# Patient Record
Sex: Female | Born: 1960 | Race: Black or African American | Hispanic: No | Smoking: Current every day smoker
Health system: Southern US, Community
[De-identification: ages and names within clinical notes are randomized; demographics above are authoritative.]

## PROBLEM LIST (undated history)

## (undated) DIAGNOSIS — I10 Essential (primary) hypertension: Secondary | ICD-10-CM

---

## 1999-04-19 ENCOUNTER — Encounter: Payer: Self-pay | Admitting: Emergency Medicine

## 1999-04-19 ENCOUNTER — Emergency Department (HOSPITAL_COMMUNITY): Admission: EM | Admit: 1999-04-19 | Discharge: 1999-04-19 | Payer: Self-pay | Admitting: Emergency Medicine

## 2009-11-21 ENCOUNTER — Emergency Department (HOSPITAL_COMMUNITY): Admission: EM | Admit: 2009-11-21 | Discharge: 2009-11-21 | Payer: Self-pay | Admitting: Emergency Medicine

## 2010-11-08 LAB — DIFFERENTIAL
Basophils Absolute: 0.1 10*3/uL (ref 0.0–0.1)
Basophils Relative: 0 % (ref 0–1)
Eosinophils Absolute: 0.3 10*3/uL (ref 0.0–0.7)
Eosinophils Relative: 3 % (ref 0–5)
Lymphocytes Relative: 30 % (ref 12–46)
Lymphs Abs: 3.4 10*3/uL (ref 0.7–4.0)
Monocytes Absolute: 0.7 10*3/uL (ref 0.1–1.0)
Monocytes Relative: 6 % (ref 3–12)
Neutro Abs: 7.1 10*3/uL (ref 1.7–7.7)
Neutrophils Relative %: 61 % (ref 43–77)

## 2010-11-08 LAB — POCT I-STAT, CHEM 8
BUN: 6 mg/dL (ref 6–23)
Calcium, Ion: 1.06 mmol/L — ABNORMAL LOW (ref 1.12–1.32)
Chloride: 109 mEq/L (ref 96–112)
Creatinine, Ser: 0.5 mg/dL (ref 0.4–1.2)
Glucose, Bld: 105 mg/dL — ABNORMAL HIGH (ref 70–99)
HCT: 38 % (ref 36.0–46.0)
Hemoglobin: 12.9 g/dL (ref 12.0–15.0)
Potassium: 3.3 mEq/L — ABNORMAL LOW (ref 3.5–5.1)
Sodium: 140 mEq/L (ref 135–145)
TCO2: 20 mmol/L (ref 0–100)

## 2010-11-08 LAB — POCT CARDIAC MARKERS
CKMB, poc: 1.2 ng/mL (ref 1.0–8.0)
Myoglobin, poc: 56.5 ng/mL (ref 12–200)
Troponin i, poc: <0.05 ng/mL (ref 0.00–0.09)

## 2010-11-08 LAB — CBC
HCT: 36 % (ref 36.0–46.0)
Hemoglobin: 12 g/dL (ref 12.0–15.0)
MCHC: 33.4 g/dL (ref 30.0–36.0)
MCV: 80.9 fL (ref 78.0–100.0)
Platelets: 210 10*3/uL (ref 150–400)
RBC: 4.46 MIL/uL (ref 3.87–5.11)
RDW: 15.9 % — ABNORMAL HIGH (ref 11.5–15.5)
WBC: 11.6 10*3/uL — ABNORMAL HIGH (ref 4.0–10.5)

## 2011-08-28 ENCOUNTER — Emergency Department (HOSPITAL_COMMUNITY)
Admission: EM | Admit: 2011-08-28 | Discharge: 2011-08-28 | Disposition: A | Discharge status: Home / Self Care | Payer: Self-pay | Attending: Emergency Medicine | Admitting: Emergency Medicine

## 2011-08-28 ENCOUNTER — Encounter: Payer: Self-pay | Admitting: *Deleted

## 2011-08-28 DIAGNOSIS — Z79899 Other long term (current) drug therapy: Secondary | ICD-10-CM | POA: Insufficient documentation

## 2011-08-28 DIAGNOSIS — F17200 Nicotine dependence, unspecified, uncomplicated: Secondary | ICD-10-CM | POA: Insufficient documentation

## 2011-08-28 DIAGNOSIS — K648 Other hemorrhoids: Secondary | ICD-10-CM | POA: Insufficient documentation

## 2011-08-28 DIAGNOSIS — K625 Hemorrhage of anus and rectum: Secondary | ICD-10-CM | POA: Insufficient documentation

## 2011-08-28 DIAGNOSIS — K921 Melena: Secondary | ICD-10-CM | POA: Insufficient documentation

## 2011-08-28 DIAGNOSIS — K644 Residual hemorrhoidal skin tags: Secondary | ICD-10-CM | POA: Insufficient documentation

## 2011-08-28 HISTORY — DX: Essential (primary) hypertension: I10

## 2011-08-28 LAB — CBC
MCH: 28.1 pg (ref 26.0–34.0)
MCHC: 34.3 g/dL (ref 30.0–36.0)
Platelets: 272 10*3/uL (ref 150–400)
RBC: 5.2 MIL/uL — ABNORMAL HIGH (ref 3.87–5.11)

## 2011-08-28 NOTE — ED Notes (Signed)
Rainbow drawn 1 blue, 1 lavender, 1 light green, 1 dark green sent to lab

## 2011-08-28 NOTE — ED Notes (Signed)
D/c instructions reviewed w/ pt - pt denies any further questions or concerns at present.   

## 2011-08-28 NOTE — ED Notes (Signed)
IV d/c'd and d/c vitals taken. RN aware and also in room explaining d/c papers.

## 2011-08-28 NOTE — ED Notes (Signed)
Pt presenting to ed with c/o rectal bleeding pt states x 2 days. Pt states blood is bright red blood noted. Pt states it could be her hemorrhoids. Pt is alert and oriented at this time. Pt is in nad

## 2011-08-28 NOTE — ED Provider Notes (Signed)
History     CSN: [id number]  Arrival date & time 08/28/11  1203   First MD Initiated Contact with Patient 08/28/11 1735      Chief Complaint  Patient presents with  . Rectal Bleeding    pt reports bright red rectal bleeding last night and again this am. pt denies complaints of pain. pt reports "feeling like something is leaking."     (Consider location/radiation/quality/duration/timing/severity/associated sxs/prior treatment) HPI Comments: Patient with known history of external hemorrhoids per her report.  Since last night she has noted bright red blood mixed with normal stool.  She's had no bleeding in between bowel movements.  She does note a recent history of being constipated and having to use stool softeners to assist her.  She reports significant straining with her bowel movements as well.  She has no abdominal pain and no pain with bowel movements at this time.  No lightheadedness, chest pain, shortness of breath or dizziness.  Patient is on no antiplatelet or anticoagulation agents.  Patient had more bleeding then she's had in the past which is what prompted her to show up in the emergency department for evaluation of her bleeding.  She has not been evaluated by a GI physician in the past per her report.  Patient is a 51 y.o. female presenting with hematochezia. The history is provided by the patient. No language interpreter was used.  Rectal Bleeding  The current episode started yesterday. The patient is experiencing no pain. The stool is described as hard. Pertinent negatives include no fever, no abdominal pain, no diarrhea, no nausea, no vomiting, no vaginal discharge, no chest pain, no headaches, no coughing and no rash.    Past Medical History  Diagnosis Date  . Hemorrhoids   . Hypertension     History reviewed. No pertinent past surgical history.  History reviewed. No pertinent family history.  History  Substance Use Topics  . Smoking status: Current Everyday Smoker    Types: Cigarettes  . Smokeless tobacco: Not on file  . Alcohol Use: Yes     occ    OB History    Grav Para Term Preterm Abortions TAB SAB Ect Mult Living                  Review of Systems  Constitutional: Negative.  Negative for fever and chills.  HENT: Negative.   Eyes: Negative.  Negative for discharge and redness.  Respiratory: Negative.  Negative for cough and shortness of breath.   Cardiovascular: Negative.  Negative for chest pain.  Gastrointestinal: Positive for hematochezia and anal bleeding. Negative for nausea, vomiting, abdominal pain and diarrhea.  Genitourinary: Negative.  Negative for dysuria and vaginal discharge.  Musculoskeletal: Negative.  Negative for back pain.  Skin: Negative.  Negative for color change and rash.  Neurological: Negative.  Negative for syncope and headaches.  Hematological: Negative.  Negative for adenopathy.  Psychiatric/Behavioral: Negative.  Negative for confusion.  All other systems reviewed and are negative.    Allergies  Review of patient's allergies indicates no known allergies.  Home Medications   Current Outpatient Rx  Name Route Sig Dispense Refill  . FAMOTIDINE 10 MG PO CHEW Oral Chew 10 mg by mouth as needed. stomach     . LISINOPRIL-HYDROCHLOROTHIAZIDE 20-25 MG PO TABS Oral Take 1 tablet by mouth daily.        BP 136/79  Pulse 77  Temp(Src) 97.9 F (36.6 C) (Oral)  Resp 18  SpO2 98%  LMP 08/15/2011  Physical Exam  Constitutional: She is oriented to person, place, and time. She appears well-developed and well-nourished.  Non-toxic appearance. She does not have a sickly appearance.  HENT:  Head: Normocephalic and atraumatic.  Eyes: Conjunctivae, EOM and lids are normal. Pupils are equal, round, and reactive to light. No scleral icterus.  Neck: Trachea normal and normal range of motion. Neck supple.  Cardiovascular: Normal rate, regular rhythm and normal heart sounds.   Pulmonary/Chest: Effort normal and breath  sounds normal. No respiratory distress. She has no wheezes. She has no rales.  Abdominal: Soft. Normal appearance. There is no tenderness. There is no rebound, no guarding and no CVA tenderness.  Genitourinary:       Patient's rectal exam demonstrates external hemorrhoids that are nontender and palpable internal hemorrhoid as well.  There is small amount of gross red blood on exam no stool palpable in the vault  Musculoskeletal: Normal range of motion.  Neurological: She is alert and oriented to person, place, and time. She has normal strength.  Skin: Skin is warm, dry and intact. No rash noted.  Psychiatric: She has a normal mood and affect. Her behavior is normal. Judgment and thought content normal.    ED Course  Procedures (including critical care time)  Results for orders placed during the hospital encounter of 08/28/11  CBC      Component Value Range   WBC 11.7 (*) 4.0 - 10.5 (K/uL)   RBC 5.20 (*) 3.87 - 5.11 (MIL/uL)   Hemoglobin 14.6  12.0 - 15.0 (g/dL)   HCT 16.1  09.6 - 04.5 (%)   MCV 81.9  78.0 - 100.0 (fL)   MCH 28.1  26.0 - 34.0 (pg)   MCHC 34.3  30.0 - 36.0 (g/dL)   RDW 40.9  81.1 - 91.4 (%)   Platelets 272  150 - 400 (K/uL)        MDM  Patient with likely rectal bleeding do to her hemorrhoids.  She has obvious external hemorrhoids and palpable internal hemorrhoid on my exam.  Her history of painless rectal bleeding is also consistent with this.  She has a stable hemoglobin at this point in time.  Patient is not on any anticoagulation or antiplatelet agents at this time.  I did have a thorough discussion with the patient regarding use of daily stool softeners and that she does not strain for her bowel movements and she understands this at time of discharge.  She also understands to followup with her primary care physician Dr. Lovell Sheehan for this and possible GI referral.  She also knows to return for any worsening bleeding, shortness of breath, lightheadedness or dizziness  associated with this.        Nat Christen, MD 08/28/11 [phone]

## 2012-01-23 ENCOUNTER — Encounter (HOSPITAL_COMMUNITY): Payer: Self-pay | Admitting: Emergency Medicine

## 2012-01-23 ENCOUNTER — Emergency Department (INDEPENDENT_AMBULATORY_CARE_PROVIDER_SITE_OTHER)
Admission: EM | Admit: 2012-01-23 | Discharge: 2012-01-23 | Disposition: A | Discharge status: Home / Self Care | Payer: Self-pay | Source: Home / Self Care | Attending: Emergency Medicine | Admitting: Emergency Medicine

## 2012-01-23 DIAGNOSIS — B029 Zoster without complications: Secondary | ICD-10-CM

## 2012-01-23 MED ORDER — ACYCLOVIR 800 MG PO TABS: 800.0000 mg | ORAL_TABLET | Freq: Every day | ORAL | Status: AC

## 2012-01-23 MED ORDER — HYDROCODONE-ACETAMINOPHEN 5-325 MG PO TABS: 2.0000 | ORAL_TABLET | ORAL | Status: AC | PRN

## 2012-01-23 MED ORDER — IBUPROFEN 600 MG PO TABS: 600.0000 mg | ORAL_TABLET | Freq: Four times a day (QID) | ORAL | Status: AC | PRN

## 2012-01-23 NOTE — ED Provider Notes (Signed)
History     CSN: [id number]  Arrival date & time 01/23/12  1333   First MD Initiated Contact with Patient 01/23/12 1426      Chief Complaint  Patient presents with  . Herpes Zoster    (Consider location/radiation/quality/duration/timing/severity/associated sxs/prior treatment) HPI Comments: Patient reports burning, stinging sensation that extended over her right upper back, under axilla to her right  breast and down her right arm 2 days ago. No further radiation down arm since first day. Reports a similar sensation in   right leg once, but none since. She reports continued paresthesias and erythema over her right upper back and extending underneath her right breast 2 days ago. Yesterday noticed a blister, and today reports multiple crops of vesicles over her back and underneath her right axilla. No nausea, vomiting, fevers. Denies rash anywhere else. Marland KitchenHer daughter "popped" one the blisters yesterday and has been rubbing alcohol on the area without relief. No aggravating factors. No new lotions, soaps, detergents. No exposure to poison ivy. She did have chickenpox as a child.  ROS as noted in HPI. All other ROS negative.   Patient is a 51 y.o. female presenting with rash. The history is provided by the patient. No language interpreter was used.  Rash  This is a new problem. The current episode started 2 days ago. The problem has been gradually worsening. The problem is associated with an unknown factor. There has been no fever. The rash is present on the torso. The pain has been constant since onset. Associated symptoms include blisters and pain. Pertinent negatives include no itching and no weeping.    Past Medical History  Diagnosis Date  . Hemorrhoids   . Hypertension     History reviewed. No pertinent past surgical history.  History reviewed. No pertinent family history.  History  Substance Use Topics  . Smoking status: Current Everyday Smoker    Types: Cigarettes  .  Smokeless tobacco: Not on file  . Alcohol Use: Yes     occ    OB History    Grav Para Term Preterm Abortions TAB SAB Ect Mult Living                  Review of Systems  Skin: Positive for rash. Negative for itching.    Allergies  Review of patient's allergies indicates no known allergies.  Home Medications   Current Outpatient Rx  Name Route Sig Dispense Refill  . LISINOPRIL-HYDROCHLOROTHIAZIDE 20-25 MG PO TABS Oral Take 1 tablet by mouth daily.      . ACYCLOVIR 800 MG PO TABS Oral Take 1 tablet (800 mg total) by mouth 5 (five) times daily. X 10 days 50 tablet 0  . FAMOTIDINE 10 MG PO CHEW Oral Chew 10 mg by mouth as needed. stomach     . HYDROCODONE-ACETAMINOPHEN 5-325 MG PO TABS Oral Take 2 tablets by mouth every 4 (four) hours as needed for pain. 20 tablet 0  . IBUPROFEN 600 MG PO TABS Oral Take 1 tablet (600 mg total) by mouth every 6 (six) hours as needed for pain. 20 tablet 0    BP 137/86  Pulse 82  Temp(Src) 98.2 F (36.8 C) (Oral)  Resp 19  SpO2 100%  LMP 01/06/2012  Physical Exam  Nursing note and vitals reviewed. Constitutional: She is oriented to person, place, and time. She appears well-developed and well-nourished. No distress.  HENT:  Head: Normocephalic and atraumatic.  Eyes: Conjunctivae and EOM are normal.  Neck: Normal range  of motion.  Cardiovascular: Normal rate.   Pulmonary/Chest: Effort normal and breath sounds normal.  Abdominal: She exhibits no distension.  Musculoskeletal: Normal range of motion.       Back:       Multiple grouped intact vesicles on erythematous base in the right T3/T4 distribution which extends to her right breast. No rash on arm or anywhere else.   Neurological: She is alert and oriented to person, place, and time.  Skin: Skin is warm and dry.  Psychiatric: She has a normal mood and affect. Her behavior is normal. Judgment and thought content normal.    ED Course  Procedures (including critical care time)  Labs  Reviewed - No data to display No results found.   1. Shingles outbreak     MDM  No signs of secondary bacterial infection. Starting on acyclovir 800 mg 5 times a day for 10 days, and additional pain medication, will have her do local wound care. Have her followup with her primary care physician as needed. Patient agrees plan.  Luiz Blare, MD 01/23/12 [phone]

## 2012-01-23 NOTE — Discharge Instructions (Signed)
Apply bacitracin or another antibiotic ointment to the blisters when they rupture and keep it covered until it heals to prevent secondary infection. Return for fever >100.4, if you get worse, or for other concerns.

## 2012-01-23 NOTE — ED Notes (Signed)
PT HERE WITH POSSIBLE  SHINGLES TO RIGHT UPPER BACK THAT FORMED X 2 DYS AGO.DAUGHTER POPPED BLISTER AND USED ALCOHOL WHICH HELPED PAIN BUT BLISTERS WORSENED.PAIN IN BACK RADIATING TO R ARM AND LEG

## 2014-09-10 ENCOUNTER — Encounter (HOSPITAL_COMMUNITY)
Admission: EM | Disposition: A | Discharge status: Home / Self Care | Payer: Self-pay | Source: Home / Self Care | Attending: Emergency Medicine

## 2014-09-10 ENCOUNTER — Ambulatory Visit: Admit: 2014-09-10 | Payer: Self-pay | Admitting: Otolaryngology

## 2014-09-10 ENCOUNTER — Encounter (HOSPITAL_COMMUNITY): Payer: Self-pay | Admitting: Registered Nurse

## 2014-09-10 ENCOUNTER — Emergency Department (HOSPITAL_COMMUNITY): Payer: Self-pay

## 2014-09-10 ENCOUNTER — Emergency Department (HOSPITAL_COMMUNITY)
Admission: EM | Admit: 2014-09-10 | Discharge: 2014-09-10 | Disposition: A | Discharge status: Home / Self Care | Payer: Self-pay | Attending: Emergency Medicine | Admitting: Emergency Medicine

## 2014-09-10 ENCOUNTER — Encounter (HOSPITAL_COMMUNITY): Payer: Self-pay | Admitting: Emergency Medicine

## 2014-09-10 DIAGNOSIS — Z79899 Other long term (current) drug therapy: Secondary | ICD-10-CM | POA: Insufficient documentation

## 2014-09-10 DIAGNOSIS — Z720 Tobacco use: Secondary | ICD-10-CM | POA: Insufficient documentation

## 2014-09-10 DIAGNOSIS — J36 Peritonsillar abscess: Secondary | ICD-10-CM | POA: Insufficient documentation

## 2014-09-10 DIAGNOSIS — I10 Essential (primary) hypertension: Secondary | ICD-10-CM | POA: Insufficient documentation

## 2014-09-10 LAB — BASIC METABOLIC PANEL
ANION GAP: 9 (ref 5–15)
BUN: 9 mg/dL (ref 6–23)
CHLORIDE: 100 mmol/L (ref 96–112)
CO2: 24 mmol/L (ref 19–32)
Calcium: 9 mg/dL (ref 8.4–10.5)
Creatinine, Ser: 0.68 mg/dL (ref 0.50–1.10)
GFR calc Af Amer: >90 mL/min (ref >90)
GFR calc non Af Amer: >90 mL/min (ref >90)
GLUCOSE: 100 mg/dL — AB (ref 70–99)
Potassium: 3.8 mmol/L (ref 3.5–5.1)
Sodium: 133 mmol/L — ABNORMAL LOW (ref 135–145)

## 2014-09-10 LAB — CBC WITH DIFFERENTIAL/PLATELET
BASOS PCT: 0 % (ref 0–1)
Basophils Absolute: 0 10*3/uL (ref 0.0–0.1)
EOS ABS: 0.2 10*3/uL (ref 0.0–0.7)
EOS PCT: 1 % (ref 0–5)
HCT: 43.4 % (ref 36.0–46.0)
HEMOGLOBIN: 14.8 g/dL (ref 12.0–15.0)
Lymphocytes Relative: 20 % (ref 12–46)
Lymphs Abs: 3.3 10*3/uL (ref 0.7–4.0)
MCH: 28.2 pg (ref 26.0–34.0)
MCHC: 34.1 g/dL (ref 30.0–36.0)
MCV: 82.7 fL (ref 78.0–100.0)
Monocytes Absolute: 1.1 10*3/uL — ABNORMAL HIGH (ref 0.1–1.0)
Monocytes Relative: 7 % (ref 3–12)
NEUTROS PCT: 72 % (ref 43–77)
Neutro Abs: 12.1 10*3/uL — ABNORMAL HIGH (ref 1.7–7.7)
Platelets: 321 10*3/uL (ref 150–400)
RBC: 5.25 MIL/uL — ABNORMAL HIGH (ref 3.87–5.11)
RDW: 14.1 % (ref 11.5–15.5)
WBC: 16.8 10*3/uL — ABNORMAL HIGH (ref 4.0–10.5)

## 2014-09-10 SURGERY — TONSILLECTOMY
Anesthesia: General

## 2014-09-10 MED ORDER — DEXAMETHASONE SODIUM PHOSPHATE 10 MG/ML IJ SOLN
INTRAMUSCULAR | Status: AC
  Filled 2014-09-10: qty 1

## 2014-09-10 MED ORDER — SODIUM CHLORIDE 0.9 % IV BOLUS (SEPSIS)
500.0000 mL | Freq: Once | INTRAVENOUS | Status: AC
  Administered 2014-09-10: 500 mL via INTRAVENOUS

## 2014-09-10 MED ORDER — BUTAMBEN-TETRACAINE-BENZOCAINE 2-2-14 % EX AERO
1.0000 | INHALATION_SPRAY | Freq: Once | CUTANEOUS | Status: AC
  Administered 2014-09-10: 1 via TOPICAL
  Filled 2014-09-10: qty 56

## 2014-09-10 MED ORDER — LIDOCAINE-EPINEPHRINE 2 %-1:100000 IJ SOLN
20.0000 mL | Freq: Once | INTRAMUSCULAR | Status: AC
  Administered 2014-09-10: 20 mL via INTRADERMAL
  Filled 2014-09-10: qty 1

## 2014-09-10 MED ORDER — ONDANSETRON HCL 4 MG/2ML IJ SOLN
INTRAMUSCULAR | Status: AC
  Filled 2014-09-10: qty 2

## 2014-09-10 MED ORDER — HYDROCODONE-ACETAMINOPHEN 7.5-325 MG/15ML PO SOLN: 15.0000 mL | ORAL | Status: DC | PRN

## 2014-09-10 MED ORDER — PROPOFOL 10 MG/ML IV BOLUS
INTRAVENOUS | Status: AC
  Filled 2014-09-10: qty 20

## 2014-09-10 MED ORDER — MORPHINE SULFATE 4 MG/ML IJ SOLN
4.0000 mg | Freq: Once | INTRAMUSCULAR | Status: AC
  Administered 2014-09-10: 4 mg via INTRAVENOUS
  Filled 2014-09-10: qty 1

## 2014-09-10 MED ORDER — AMOXICILLIN 250 MG/5ML PO SUSR: ORAL | Status: DC

## 2014-09-10 MED ORDER — LIDOCAINE HCL 1 % IJ SOLN: 30.0000 mL | Freq: Once | INTRAMUSCULAR | Status: DC

## 2014-09-10 MED ORDER — MORPHINE SULFATE 2 MG/ML IJ SOLN
2.0000 mg | Freq: Once | INTRAMUSCULAR | Status: AC
  Administered 2014-09-10: 2 mg via INTRAVENOUS
  Filled 2014-09-10: qty 1

## 2014-09-10 MED ORDER — LIDOCAINE HCL (CARDIAC) 20 MG/ML IV SOLN
INTRAVENOUS | Status: AC
  Filled 2014-09-10: qty 5

## 2014-09-10 MED ORDER — IOHEXOL 300 MG/ML  SOLN
100.0000 mL | Freq: Once | INTRAMUSCULAR | Status: AC | PRN
  Administered 2014-09-10: 100 mL via INTRAVENOUS

## 2014-09-10 MED ORDER — FENTANYL CITRATE 0.05 MG/ML IJ SOLN
INTRAMUSCULAR | Status: AC
  Filled 2014-09-10: qty 5

## 2014-09-10 MED ORDER — DEXAMETHASONE SODIUM PHOSPHATE 10 MG/ML IJ SOLN
10.0000 mg | Freq: Once | INTRAMUSCULAR | Status: AC
  Administered 2014-09-10: 10 mg via INTRAVENOUS
  Filled 2014-09-10: qty 1

## 2014-09-10 MED ORDER — MIDAZOLAM HCL 2 MG/2ML IJ SOLN
2.0000 mg | Freq: Once | INTRAMUSCULAR | Status: AC
  Administered 2014-09-10: 2 mg via INTRAVENOUS
  Filled 2014-09-10: qty 2

## 2014-09-10 MED ORDER — CLINDAMYCIN PHOSPHATE 600 MG/50ML IV SOLN
600.0000 mg | Freq: Once | INTRAVENOUS | Status: AC
  Administered 2014-09-10: 600 mg via INTRAVENOUS
  Filled 2014-09-10: qty 50

## 2014-09-10 MED ORDER — MIDAZOLAM HCL 2 MG/2ML IJ SOLN
INTRAMUSCULAR | Status: AC
  Filled 2014-09-10: qty 2

## 2014-09-10 SURGICAL SUPPLY — 19 items
CATH ROBINSON RED A/P 10FR (CATHETERS) ×3 IMPLANT
CLEANER TIP ELECTROSURG 2X2 (MISCELLANEOUS) ×3 IMPLANT
COVER SURGICAL LIGHT HANDLE (MISCELLANEOUS) ×3 IMPLANT
ELECT COATED BLADE 2.86 ST (ELECTRODE) ×3 IMPLANT
ELECT REM PT RETURN 9FT ADLT (ELECTROSURGICAL) ×3
ELECTRODE REM PT RTRN 9FT ADLT (ELECTROSURGICAL) ×1 IMPLANT
GAUZE SPONGE 4X4 16PLY XRAY LF (GAUZE/BANDAGES/DRESSINGS) ×3 IMPLANT
GLOVE ECLIPSE 8.0 STRL XLNG CF (GLOVE) ×3 IMPLANT
KIT BASIN OR (CUSTOM PROCEDURE TRAY) ×3 IMPLANT
NEEDLE SPNL 22GX3.5 QUINCKE BK (NEEDLE) ×3 IMPLANT
NS IRRIG 1000ML POUR BTL (IV SOLUTION) ×3 IMPLANT
PACK BASIC VI WITH GOWN DISP (CUSTOM PROCEDURE TRAY) ×3 IMPLANT
PENCIL FOOT CONTROL (ELECTRODE) ×3 IMPLANT
SPONGE TONSIL 1 RF SGL (DISPOSABLE) ×6 IMPLANT
SWAB COLLECTION DEVICE MRSA (MISCELLANEOUS) ×6 IMPLANT
SYR BULB 3OZ (MISCELLANEOUS) ×3 IMPLANT
TUBE ANAEROBIC SPECIMEN COL (MISCELLANEOUS) ×6 IMPLANT
WATER STERILE IRR 1500ML POUR (IV SOLUTION) ×3 IMPLANT
YANKAUER SUCT BULB TIP 10FT TU (MISCELLANEOUS) ×3 IMPLANT

## 2014-09-10 NOTE — Discharge Instructions (Signed)
Amoxicillin as prescribed.  Hydrocodone syrup as prescribed as needed for pain.  Follow-up with Dr. Lazarus SalinesWolicki next week. The contact information for his office has been provided in this discharge summary. Call on Monday to arrange this appointment.  Return to the emergency department if you develop difficulty breathing or an inability to swallow.   Peritonsillar Abscess A peritonsillar abscess is a collection of pus located in the back of the throat behind the tonsils. It usually occurs when a streptococcal infection of the throat or tonsils spreads into the space around the tonsils. They are almost always caused by the streptococcal germ (bacteria). The treatment of a peritonsillar abscess is most often drainage accomplished by putting a needle into the abscess or cutting (incising) and draining the abscess. This is most often followed with a course of antibiotics. HOME CARE INSTRUCTIONS  If your abscess was drained by your caregiver today, rinse your throat (gargle) with warm salt water four times per day or as needed for comfort. Do not swallow this mixture. Mix 1 teaspoon of salt in 8 ounces of warm water for gargling.  Rest in bed as needed. Resume activities as able.  Apply cold to your neck for pain relief. Fill a plastic bag with ice and wrap it in a towel. Hold the ice on your neck for 20 minutes 4 times per day.  Eat a soft or liquid diet as tolerated while your throat remains sore. Popsicles and ice cream may be good early choices. Drinking plenty of cold fluids will probably be soothing and help take swelling down in between the warm gargles.  Only take over-the-counter or prescription medicines for pain, discomfort, or fever as directed by your caregiver. Do not use aspirin unless directed by your physician. Aspirin slows down the clotting process. It can also cause bleeding from the drainage area if this was needled or incised today.  If antibiotics were prescribed, take them as  directed for the full course of the prescription. Even if you feel you are well, you need to take them. SEEK MEDICAL CARE IF:   You have increased pain, swelling, redness, or drainage in your throat.  You develop signs of infection such as dizziness, headache, lethargy, or generalized feelings of illness.  You have difficulty breathing, swallowing or eating.  You show signs of becoming dehydrated (lightheadedness when standing, decreased urine output, a fast heart rate, or dry mouth and mucous membranes). SEEK IMMEDIATE MEDICAL CARE IF:   You have a fever.  You are coughing up or vomiting blood.  You develop more severe throat pain uncontrolled with medicines or you start to drool.  You develop difficulty breathing, talking, or find it easier to breathe while leaning forward. Document Released: 08/06/2005 Document Revised: 10/29/2011 Document Reviewed: 03/19/2008 [facility] Patient Information 2015 [place]. This information is not intended to replace advice given to you by your health care provider. Make sure you discuss any questions you have with your health care provider.

## 2014-09-10 NOTE — ED Provider Notes (Addendum)
CSN: [id number]     Arrival date & time 09/10/14  1445 History   First MD Initiated Contact with Patient 09/10/14 1454     Chief Complaint  Patient presents with  . Oral Swelling  . Sore Throat     (Consider location/radiation/quality/duration/timing/severity/associated sxs/prior Treatment) HPI Comments: Patient is a 54 year old female with past mental history of hypertension and gastric ulcers. She presents with complaints of sore throat for the past several days that has significantly worsened. She is now having swelling and increased pain to the right side of her neck and throat. She is having difficulty opening her mouth and swallowing due to this discomfort. She denies any difficulty breathing. She denies any fevers or chills.  Patient is a 54 y.o. female presenting with pharyngitis. The history is provided by the patient.  Sore Throat This is a new problem. Episode onset: 3 days ago. The problem occurs constantly. The problem has been rapidly worsening. Pertinent negatives include no chest pain, no abdominal pain, no headaches and no shortness of breath. The symptoms are aggravated by swallowing (Opening mouth). Nothing relieves the symptoms. She has tried nothing for the symptoms. The treatment provided no relief.    Past Medical History  Diagnosis Date  . Hemorrhoids   . Hypertension    History reviewed. No pertinent past surgical history. No family history on file. History  Substance Use Topics  . Smoking status: Current Every Day Smoker    Types: Cigarettes  . Smokeless tobacco: Not on file  . Alcohol Use: Yes     Comment: occ   OB History    No data available     Review of Systems  Respiratory: Negative for shortness of breath.   Cardiovascular: Negative for chest pain.  Gastrointestinal: Negative for abdominal pain.  Neurological: Negative for headaches.  All other systems reviewed and are negative.     Allergies  Review of patient's allergies indicates no  known allergies.  Home Medications   Prior to Admission medications   Medication Sig Start Date End Date Taking? Authorizing Provider  famotidine (PEPCID AC) 10 MG chewable tablet Chew 10 mg by mouth as needed. stomach     Historical Provider, MD  lisinopril-hydrochlorothiazide (PRINZIDE,ZESTORETIC) 20-25 MG per tablet Take 1 tablet by mouth daily.      Historical Provider, MD   BP 188/89 mmHg  Pulse 96  Temp(Src) 97.8 F (36.6 C) (Oral)  Resp 20  SpO2 98%  LMP 08/20/2014 Physical Exam  Constitutional: She is oriented to person, place, and time. She appears well-developed and well-nourished. No distress.  HENT:  Head: Normocephalic and atraumatic.  The posterior oropharynx is erythematous and swollen. The right tonsil is swollen and deviated. There is significant discomfort with opening the mouth and with palpation of the soft tissues of the anterior neck. There is no crepitus. There is no stridor.  Neck: Normal range of motion. Neck supple.  Cardiovascular: Normal rate and regular rhythm.  Exam reveals no gallop and no friction rub.   No murmur heard. Pulmonary/Chest: Effort normal and breath sounds normal. No respiratory distress. She has no wheezes.  Abdominal: Soft. Bowel sounds are normal. She exhibits no distension. There is no tenderness.  Musculoskeletal: Normal range of motion.  Neurological: She is alert and oriented to person, place, and time.  Skin: Skin is warm and dry. She is not diaphoretic.  Nursing note and vitals reviewed.   ED Course  Procedures (including critical care time) Labs Review Labs Reviewed  BASIC  METABOLIC PANEL  CBC WITH DIFFERENTIAL    Imaging Review No results found.   EKG Interpretation None      MDM   Final diagnoses:  None    CT scan reveals a large peritonsillar abscess with market soft tissue inflammation. She has a white count of 17,000. She was given IV fluids and clindamycin. I've spoken with Dr. Lazarus SalinesWolicki from ENT who will  evaluate patient in the emergency department.    Geoffery Lyonsouglas Payson Evrard, MD 09/10/14 1659   Patient seen by ENT. Incision and drainage performed. Patient stable for discharge.  Geoffery Lyonsouglas Tanijah Morais, MD 09/10/14 [phone]

## 2014-09-10 NOTE — Progress Notes (Signed)
Pt confirmed with ED CM that she no longer sees Dr Della GooHarvette Jenkins. Has no pcp.  CM spoke [place]. CM discussed and provided written information for self pay pcps, importance of pcp for f/u care, www.needymeds.org, www.goodrx.com, discounted pharmacies and other [place] resources such as [facility], [facility], affordable care act,  financial assistance, DSS and  health department  Reviewed resources for [place] self [place] pcps like [facility], family medicine at [address], [facility], general medical clinics, [facility] plus others, medication resources, [facility] out patient pharmacies and housing Pt voiced understanding and appreciation of resources provided   Provided [facility] contact information Agreed to [facility]. Verified correct address and telephone number in EPIC. Referral completed

## 2014-09-10 NOTE — Consult Note (Signed)
Ellen Simmons, Ellen Simmons 54 y.o., female [id number]     Chief Complaint: sore throat  HPI: 54 yo bf, developed sore throat x 7 days.  Worse, and localizing to the RIGHT side the last 3 days.  No treatment thus far.  Painful to swallow.  Painful trismus.  No breathing difficulty.  No documented fever.  No prior issues with sore throat, strep throat, tonsillitis, or peritonsillar abscess.    No immune compromise including DM, HIV, Prednisone therapy.  Received fluids and IV Clinda in ER.  CT shows a large lucent fluid collection, RIGHT intra-tonsillar or peri-tonsillar.    Some pharyngeal and laryngeal edema.   WBC 16.8 K.  PMH: Past Medical History  Diagnosis Date  . Hemorrhoids   . Hypertension     Surg FB:PZWCHEN reviewed. No pertinent past surgical history.  FHx:  History reviewed. No pertinent family history. SocHx:  reports that she has been smoking Cigarettes.  She does not have any smokeless tobacco history on file. She reports that she drinks alcohol. She reports that she uses illicit drugs (Marijuana).  ALLERGIES: No Known Allergies   (Not in a hospital admission)  Results for orders placed or performed during the hospital encounter of 09/10/14 (from the past 48 hour(s))  Basic metabolic panel     Status: Abnormal   Collection Time: 09/10/14  3:10 PM  Result Value Ref Range   Sodium 133 (L) 135 - 145 mmol/L   Potassium 3.8 3.5 - 5.1 mmol/L   Chloride 100 96 - 112 mmol/L   CO2 24 19 - 32 mmol/L   Glucose, Bld 100 (H) 70 - 99 mg/dL   BUN 9 6 - 23 mg/dL   Creatinine, Ser 0.68 0.50 - 1.10 mg/dL   Calcium 9.0 8.4 - 10.5 mg/dL   GFR calc non Af Amer >90 >90 mL/min   GFR calc Af Amer >90 >90 mL/min    Comment: (NOTE) The eGFR has been calculated using the CKD EPI equation. This calculation has not been validated in all clinical situations. eGFR's persistently <90 mL/min signify possible Chronic Kidney Disease.    Anion gap 9 5 - 15  CBC with Differential     Status: Abnormal    Collection Time: 09/10/14  3:10 PM  Result Value Ref Range   WBC 16.8 (H) 4.0 - 10.5 K/uL   RBC 5.25 (H) 3.87 - 5.11 MIL/uL   Hemoglobin 14.8 12.0 - 15.0 g/dL   HCT 43.4 36.0 - 46.0 %   MCV 82.7 78.0 - 100.0 fL   MCH 28.2 26.0 - 34.0 pg   MCHC 34.1 30.0 - 36.0 g/dL   RDW 14.1 11.5 - 15.5 %   Platelets 321 150 - 400 K/uL   Neutrophils Relative % 72 43 - 77 %   Neutro Abs 12.1 (H) 1.7 - 7.7 K/uL   Lymphocytes Relative 20 12 - 46 %   Lymphs Abs 3.3 0.7 - 4.0 K/uL   Monocytes Relative 7 3 - 12 %   Monocytes Absolute 1.1 (H) 0.1 - 1.0 K/uL   Eosinophils Relative 1 0 - 5 %   Eosinophils Absolute 0.2 0.0 - 0.7 K/uL   Basophils Relative 0 0 - 1 %   Basophils Absolute 0.0 0.0 - 0.1 K/uL   Ct Soft Tissue Neck W Contrast  09/10/2014   CLINICAL DATA:  54 year old female with a right-sided sore throat for the past week, fever and dysphagia  EXAM: CT NECK WITH CONTRAST  TECHNIQUE: Multidetector CT imaging of  the neck was performed using the standard protocol following the bolus administration of intravenous contrast.  CONTRAST:  174m OMNIPAQUE IOHEXOL 300 MG/ML  SOLN  COMPARISON:  None.  FINDINGS: Pharynx and larynx: Hyper enhancement of the pharyngeal mucosa. There is diffuse enlargement of the right faucial tonsil. There is a large centrally low attenuating collection with thin peripheral enhancement consistent with an abscess in the right faucial tonsil measuring 2.3 x 1.6 x 2.2 cm. Extensive edema is noted involving the epiglottis, and tracking inferiorly along the aryepiglottic folds nearly to the false cords. The supraglottic airway is mildly narrowed. The nasopharyngeal airway is mildly narrowed and deviated from the right left. There is effacement of the right vallecula and right piriform sinus.  Salivary glands: Within normal limits.  Thyroid: Within normal limits.  Lymph nodes: Right cervical chain lymph nodes remain within normal limits for size and are likely reactive.  Vascular: Common  origin of the right brachiocephalic and left common carotid arteries incidentally noted. No acute vascular abnormality.  Limited intracranial: Unremarkable.  Mastoids and visualized paranasal sinuses: Normally aerated.  Skeleton: No acute fracture or aggressive appearing lytic or blastic osseous lesion. C6-C7 cervical spondylosis.  Upper chest: The lungs are clear.  Other: Very poor dentition with numerous carious teeth and periodontal disease.  IMPRESSION: 1. Acute pharyngitis and tonsillitis with large 2.3 x 1.6 x 2.2 cm abscess centered in the right faucial tonsil. There is extensive soft tissue edema extending into the epiglottis and down the aryepiglottic folds resulting in effacement of the right piriform sinus, and right vallecula. The nasopharyngeal and supraglottic airways are mildly narrowed but remain patent. No definite extension of abscess and the deep spaces of the neck although edema is noted in the right parapharyngeal space. 2. Reactive right cervical adenopathy. 3. Poor dentition with numerous carious teeth and periodontal disease.   Electronically Signed   By: HJacqulynn CadetM.D.   On: 09/10/2014 16:31    Blood pressure 195/90, pulse 106, temperature 97.8 F (36.6 C), temperature source Oral, resp. rate 20, last menstrual period 08/20/2014, SpO2 94 %.  PHYSICAL EXAM: Overall appearance:  Distressed.  + fetor oris.  Sl "hot potato" voice.  Mod trismus.  Voice phonatory, breathing easily. Head:  NCAT Ears:  clear Nose:  clear Oral Cavity:   Moist.  Few remaining teeth in poor repair.   Oral Pharynx/Hypopharynx/Larynx:  Swollen bulging RIGHT hemi soft palate, with uvular deviation to LEFT.  Minimal uvular edema.  LEFT tonsil poorly seen. Neuro:  Grossly nl Neck:  Sl tender RIGHT JDG, but no distinct adenopathy  Studies Reviewed:  CT neck    Assessment/Plan RIGHT peritonsillar abscess.  I discussed with the patient and her daughter.  I recommend I&D tonight, and given its  superficial location feel we can do this in the ER.  Questions were answered and informed consent obtained.  IV morphine 2 mg, Versed 2 mg as pre med given.    The pharynx was anesthetized with Cetacaine spray topical, then 10 ml total of 2% xylocaine with 1:100,000 epi in stages.  Upon ascertaining adequate anesthesia, a 2 cm crescent incision was made over the RIGHT superior pole of the tonsil.  No pus was encountered.  The incision was deepened bluntly with a tonsil hemostat.  Again, no pus encountered.  I probed into the body of the tonsil itself also with no pus.  No further attempt at drainage was felt indicated.    Will send her home with liquid Hydrocodone, 10-20  ml po q4h prn pain, and liquid Amoxicillin, 1 gm tid x 3 days, then 500 mg tid x 7 days.  She may advance diet and activity as comfortable.    I will see her in my office in 2 weeks if she is doing well,   3 days if she is having significant problems.  With a first episode, she would not warrant a tonsillectomy.    Ellen Simmons 1/90/1222, 6:52 PM

## 2014-09-10 NOTE — ED Notes (Signed)
Pt c/o sore throat and swelling to right side of neck that has gotten worse over the past couple of weeks. Pt state that she is getting to point she having hard time swallowing.  Pt also c/o sore on her left hand.

## 2018-10-13 ENCOUNTER — Other Ambulatory Visit (HOSPITAL_COMMUNITY): Payer: Self-pay | Admitting: *Deleted

## 2018-10-13 DIAGNOSIS — Z1231 Encounter for screening mammogram for malignant neoplasm of breast: Secondary | ICD-10-CM

## 2018-12-04 ENCOUNTER — Telehealth (HOSPITAL_COMMUNITY): Payer: Self-pay

## 2018-12-04 NOTE — Telephone Encounter (Signed)
Left message with patient about [facility] appointment that will need to be rescheduled due to CO-[id number]. Left name and number for patient to call back. °

## 2018-12-24 ENCOUNTER — Other Ambulatory Visit (HOSPITAL_COMMUNITY): Payer: Self-pay | Admitting: *Deleted

## 2018-12-24 DIAGNOSIS — Z1231 Encounter for screening mammogram for malignant neoplasm of breast: Secondary | ICD-10-CM

## 2019-01-15 ENCOUNTER — Ambulatory Visit (HOSPITAL_COMMUNITY): Payer: Self-pay

## 2019-04-09 ENCOUNTER — Ambulatory Visit (HOSPITAL_COMMUNITY): Payer: Self-pay

## 2021-07-04 ENCOUNTER — Emergency Department (HOSPITAL_COMMUNITY): Payer: 59

## 2021-07-04 ENCOUNTER — Encounter (HOSPITAL_COMMUNITY): Payer: Self-pay | Admitting: Emergency Medicine

## 2021-07-04 ENCOUNTER — Other Ambulatory Visit: Payer: Self-pay

## 2021-07-04 ENCOUNTER — Inpatient Hospital Stay (HOSPITAL_COMMUNITY)
Admission: EM | Admit: 2021-07-04 | Discharge: 2021-07-12 | DRG: 208 | Disposition: A | Discharge status: Home / Self Care | Payer: 59 | Attending: Internal Medicine | Admitting: Internal Medicine

## 2021-07-04 DIAGNOSIS — I959 Hypotension, unspecified: Secondary | ICD-10-CM | POA: Diagnosis present

## 2021-07-04 DIAGNOSIS — Z20822 Contact with and (suspected) exposure to covid-19: Secondary | ICD-10-CM | POA: Diagnosis present

## 2021-07-04 DIAGNOSIS — K5900 Constipation, unspecified: Secondary | ICD-10-CM | POA: Diagnosis present

## 2021-07-04 DIAGNOSIS — R9430 Abnormal result of cardiovascular function study, unspecified: Secondary | ICD-10-CM | POA: Diagnosis not present

## 2021-07-04 DIAGNOSIS — I5021 Acute systolic (congestive) heart failure: Secondary | ICD-10-CM | POA: Diagnosis present

## 2021-07-04 DIAGNOSIS — I130 Hypertensive heart and chronic kidney disease with heart failure and stage 1 through stage 4 chronic kidney disease, or unspecified chronic kidney disease: Secondary | ICD-10-CM | POA: Diagnosis present

## 2021-07-04 DIAGNOSIS — I161 Hypertensive emergency: Secondary | ICD-10-CM | POA: Diagnosis present

## 2021-07-04 DIAGNOSIS — J9602 Acute respiratory failure with hypercapnia: Secondary | ICD-10-CM | POA: Diagnosis present

## 2021-07-04 DIAGNOSIS — J9601 Acute respiratory failure with hypoxia: Secondary | ICD-10-CM | POA: Diagnosis present

## 2021-07-04 DIAGNOSIS — G929 Unspecified toxic encephalopathy: Secondary | ICD-10-CM | POA: Diagnosis present

## 2021-07-04 DIAGNOSIS — N182 Chronic kidney disease, stage 2 (mild): Secondary | ICD-10-CM | POA: Diagnosis present

## 2021-07-04 DIAGNOSIS — R9431 Abnormal electrocardiogram [ECG] [EKG]: Secondary | ICD-10-CM | POA: Diagnosis present

## 2021-07-04 DIAGNOSIS — N179 Acute kidney failure, unspecified: Secondary | ICD-10-CM

## 2021-07-04 DIAGNOSIS — R519 Headache, unspecified: Secondary | ICD-10-CM | POA: Diagnosis present

## 2021-07-04 DIAGNOSIS — F1910 Other psychoactive substance abuse, uncomplicated: Secondary | ICD-10-CM | POA: Diagnosis not present

## 2021-07-04 DIAGNOSIS — Z91410 Personal history of adult physical and sexual abuse: Secondary | ICD-10-CM | POA: Diagnosis not present

## 2021-07-04 DIAGNOSIS — E1165 Type 2 diabetes mellitus with hyperglycemia: Secondary | ICD-10-CM | POA: Diagnosis present

## 2021-07-04 DIAGNOSIS — F1423 Cocaine dependence with withdrawal: Secondary | ICD-10-CM | POA: Diagnosis not present

## 2021-07-04 DIAGNOSIS — J810 Acute pulmonary edema: Secondary | ICD-10-CM | POA: Diagnosis present

## 2021-07-04 DIAGNOSIS — F17210 Nicotine dependence, cigarettes, uncomplicated: Secondary | ICD-10-CM | POA: Diagnosis present

## 2021-07-04 DIAGNOSIS — E1122 Type 2 diabetes mellitus with diabetic chronic kidney disease: Secondary | ICD-10-CM | POA: Diagnosis present

## 2021-07-04 DIAGNOSIS — F1210 Cannabis abuse, uncomplicated: Secondary | ICD-10-CM | POA: Diagnosis present

## 2021-07-04 DIAGNOSIS — Z79899 Other long term (current) drug therapy: Secondary | ICD-10-CM

## 2021-07-04 DIAGNOSIS — R0609 Other forms of dyspnea: Secondary | ICD-10-CM | POA: Diagnosis not present

## 2021-07-04 DIAGNOSIS — E861 Hypovolemia: Secondary | ICD-10-CM | POA: Diagnosis present

## 2021-07-04 DIAGNOSIS — N951 Menopausal and female climacteric states: Secondary | ICD-10-CM | POA: Diagnosis not present

## 2021-07-04 DIAGNOSIS — R451 Restlessness and agitation: Secondary | ICD-10-CM | POA: Diagnosis not present

## 2021-07-04 LAB — COMPREHENSIVE METABOLIC PANEL
ALT: 191 U/L — ABNORMAL HIGH (ref 0–44)
AST: 280 U/L — ABNORMAL HIGH (ref 15–41)
Albumin: 4.2 g/dL (ref 3.5–5.0)
Alkaline Phosphatase: 184 U/L — ABNORMAL HIGH (ref 38–126)
Anion gap: 11 (ref 5–15)
BUN: 19 mg/dL (ref 6–20)
CO2: 24 mmol/L (ref 22–32)
Calcium: 8.9 mg/dL (ref 8.9–10.3)
Chloride: 106 mmol/L (ref 98–111)
Creatinine, Ser: 1.08 mg/dL — ABNORMAL HIGH (ref 0.44–1.00)
GFR, Estimated: 60 mL/min — ABNORMAL LOW (ref >60)
Glucose, Bld: 285 mg/dL — ABNORMAL HIGH (ref 70–99)
Potassium: 3.7 mmol/L (ref 3.5–5.1)
Sodium: 141 mmol/L (ref 135–145)
Total Bilirubin: 0.8 mg/dL (ref 0.3–1.2)
Total Protein: 8.2 g/dL — ABNORMAL HIGH (ref 6.5–8.1)

## 2021-07-04 LAB — URINALYSIS, ROUTINE W REFLEX MICROSCOPIC
Bilirubin Urine: NEGATIVE
Glucose, UA: >=500 mg/dL — AB
Ketones, ur: NEGATIVE mg/dL
Leukocytes,Ua: NEGATIVE
Nitrite: NEGATIVE
Protein, ur: 100 mg/dL — AB
Specific Gravity, Urine: 1.006 (ref 1.005–1.030)
pH: 6 (ref 5.0–8.0)

## 2021-07-04 LAB — MRSA NEXT GEN BY PCR, NASAL: MRSA by PCR Next Gen: NOT DETECTED

## 2021-07-04 LAB — CBC WITH DIFFERENTIAL/PLATELET
Abs Immature Granulocytes: 0.15 10*3/uL — ABNORMAL HIGH (ref 0.00–0.07)
Basophils Absolute: 0.1 10*3/uL (ref 0.0–0.1)
Basophils Relative: 1 %
Eosinophils Absolute: 0.2 10*3/uL (ref 0.0–0.5)
Eosinophils Relative: 1 %
HCT: 50.8 % — ABNORMAL HIGH (ref 36.0–46.0)
Hemoglobin: 16.2 g/dL — ABNORMAL HIGH (ref 12.0–15.0)
Immature Granulocytes: 1 %
Lymphocytes Relative: 36 %
Lymphs Abs: 6.2 10*3/uL — ABNORMAL HIGH (ref 0.7–4.0)
MCH: 26.9 pg (ref 26.0–34.0)
MCHC: 31.9 g/dL (ref 30.0–36.0)
MCV: 84.4 fL (ref 80.0–100.0)
Monocytes Absolute: 0.6 10*3/uL (ref 0.1–1.0)
Monocytes Relative: 4 %
Neutro Abs: 10.1 10*3/uL — ABNORMAL HIGH (ref 1.7–7.7)
Neutrophils Relative %: 57 %
Platelets: 268 10*3/uL (ref 150–400)
RBC: 6.02 MIL/uL — ABNORMAL HIGH (ref 3.87–5.11)
RDW: 15.1 % (ref 11.5–15.5)
WBC: 17.4 10*3/uL — ABNORMAL HIGH (ref 4.0–10.5)
nRBC: 0 % (ref 0.0–0.2)

## 2021-07-04 LAB — BLOOD GAS, ARTERIAL
Acid-base deficit: 6.4 mmol/L — ABNORMAL HIGH (ref 0.0–2.0)
Acid-base deficit: 3.8 mmol/L — ABNORMAL HIGH (ref 0.0–2.0)
Acid-base deficit: 0.5 mmol/L (ref 0.0–2.0)
Bicarbonate: 23.3 mmol/L (ref 20.0–28.0)
Bicarbonate: 25.3 mmol/L (ref 20.0–28.0)
Bicarbonate: 24.9 mmol/L (ref 20.0–28.0)
Drawn by: 42246
Drawn by: 25770
FIO2: 100
FIO2: 100
FIO2: 60
MECHVT: 400 mL
MECHVT: 400 mL
MECHVT: 400 mL
O2 Saturation: 91.5 %
O2 Saturation: 97 %
O2 Saturation: 98.1 %
PEEP: 5 cmH2O
PEEP: 8 cmH2O
PEEP: 8 cmH2O
Patient temperature: 98.6
Patient temperature: 98.6
Patient temperature: 98.6
RATE: 18 resp/min
RATE: 18 resp/min
RATE: 18 resp/min
pCO2 arterial: 65.6 mmHg (ref 32.0–48.0)
pCO2 arterial: 64.5 mmHg — ABNORMAL HIGH (ref 32.0–48.0)
pCO2 arterial: 46.2 mmHg (ref 32.0–48.0)
pH, Arterial: 7.177 — CL (ref 7.350–7.450)
pH, Arterial: 7.217 — ABNORMAL LOW (ref 7.350–7.450)
pH, Arterial: 7.351 (ref 7.350–7.450)
pO2, Arterial: 80.6 mmHg — ABNORMAL LOW (ref 83.0–108.0)
pO2, Arterial: 120 mmHg — ABNORMAL HIGH (ref 83.0–108.0)
pO2, Arterial: 116 mmHg — ABNORMAL HIGH (ref 83.0–108.0)

## 2021-07-04 LAB — GLUCOSE, CAPILLARY
Glucose-Capillary: 124 mg/dL — ABNORMAL HIGH (ref 70–99)
Glucose-Capillary: 157 mg/dL — ABNORMAL HIGH (ref 70–99)
Glucose-Capillary: 167 mg/dL — ABNORMAL HIGH (ref 70–99)

## 2021-07-04 LAB — PHOSPHORUS
Phosphorus: 5.5 mg/dL — ABNORMAL HIGH (ref 2.5–4.6)
Phosphorus: 4.7 mg/dL — ABNORMAL HIGH (ref 2.5–4.6)

## 2021-07-04 LAB — RAPID URINE DRUG SCREEN, HOSP PERFORMED
Amphetamines: NOT DETECTED
Barbiturates: NOT DETECTED
Benzodiazepines: NOT DETECTED
Cocaine: POSITIVE — AB
Opiates: NOT DETECTED
Tetrahydrocannabinol: POSITIVE — AB

## 2021-07-04 LAB — TROPONIN I (HIGH SENSITIVITY)
Troponin I (High Sensitivity): 56 ng/L — ABNORMAL HIGH (ref <18)
Troponin I (High Sensitivity): 94 ng/L — ABNORMAL HIGH (ref <18)

## 2021-07-04 LAB — RESP PANEL BY RT-PCR (FLU A&B, COVID) ARPGX2
Influenza A by PCR: NEGATIVE
Influenza B by PCR: NEGATIVE
SARS Coronavirus 2 by RT PCR: NEGATIVE

## 2021-07-04 LAB — MAGNESIUM: Magnesium: 2.4 mg/dL (ref 1.7–2.4)

## 2021-07-04 LAB — BRAIN NATRIURETIC PEPTIDE: B Natriuretic Peptide: 242 pg/mL — ABNORMAL HIGH (ref 0.0–100.0)

## 2021-07-04 MED ORDER — VITAL HIGH PROTEIN PO LIQD: 1000.0000 mL | ORAL | Status: DC

## 2021-07-04 MED ORDER — FUROSEMIDE 10 MG/ML IJ SOLN
40.0000 mg | Freq: Once | INTRAMUSCULAR | Status: AC
  Administered 2021-07-04: 40 mg via INTRAVENOUS
  Filled 2021-07-04: qty 4

## 2021-07-04 MED ORDER — CHLORHEXIDINE GLUCONATE 0.12% ORAL RINSE (MEDLINE KIT)
15.0000 mL | Freq: Two times a day (BID) | OROMUCOSAL | Status: DC
  Administered 2021-07-04 – 2021-07-09 (×4): 15 mL via OROMUCOSAL

## 2021-07-04 MED ORDER — NOREPINEPHRINE 4 MG/250ML-% IV SOLN
2.0000 ug/min | INTRAVENOUS | Status: DC
  Administered 2021-07-04: 2 ug/min via INTRAVENOUS
  Administered 2021-07-05: 5 ug/min via INTRAVENOUS
  Filled 2021-07-04 (×2): qty 250

## 2021-07-04 MED ORDER — ONDANSETRON HCL 4 MG/2ML IJ SOLN
4.0000 mg | Freq: Four times a day (QID) | INTRAMUSCULAR | Status: DC | PRN
  Administered 2021-07-04 – 2021-07-08 (×2): 4 mg via INTRAVENOUS
  Filled 2021-07-04: qty 2

## 2021-07-04 MED ORDER — NITROGLYCERIN IN D5W 200-5 MCG/ML-% IV SOLN
INTRAVENOUS | Status: AC
  Administered 2021-07-04: 50 mg
  Filled 2021-07-04: qty 250

## 2021-07-04 MED ORDER — KETAMINE HCL 50 MG/5ML IJ SOSY
PREFILLED_SYRINGE | INTRAMUSCULAR | Status: AC
  Filled 2021-07-04: qty 5

## 2021-07-04 MED ORDER — PROSOURCE TF PO LIQD: 45.0000 mL | Freq: Two times a day (BID) | ORAL | Status: DC

## 2021-07-04 MED ORDER — ETOMIDATE 2 MG/ML IV SOLN
INTRAVENOUS | Status: AC
  Administered 2021-07-04: 20 mg
  Filled 2021-07-04: qty 20

## 2021-07-04 MED ORDER — MIDAZOLAM HCL 2 MG/2ML IJ SOLN
INTRAMUSCULAR | Status: AC
  Filled 2021-07-04: qty 2

## 2021-07-04 MED ORDER — ROCURONIUM BROMIDE 10 MG/ML (PF) SYRINGE
PREFILLED_SYRINGE | INTRAVENOUS | Status: AC
  Administered 2021-07-04: 100 mg
  Filled 2021-07-04: qty 10

## 2021-07-04 MED ORDER — FREE WATER: 100.0000 mL | Freq: Four times a day (QID) | Status: DC

## 2021-07-04 MED ORDER — ORAL CARE MOUTH RINSE
15.0000 mL | OROMUCOSAL | Status: DC
  Administered 2021-07-04 – 2021-07-09 (×12): 15 mL via OROMUCOSAL

## 2021-07-04 MED ORDER — LABETALOL HCL 5 MG/ML IV SOLN
20.0000 mg | INTRAVENOUS | Status: DC | PRN
  Administered 2021-07-04 – 2021-07-06 (×3): 20 mg via INTRAVENOUS
  Filled 2021-07-04 (×4): qty 4

## 2021-07-04 MED ORDER — DEXMEDETOMIDINE HCL IN NACL 400 MCG/100ML IV SOLN
0.4000 ug/kg/h | INTRAVENOUS | Status: DC
  Administered 2021-07-04: 1.1 ug/kg/h via INTRAVENOUS
  Administered 2021-07-05: 1 ug/kg/h via INTRAVENOUS
  Filled 2021-07-04 (×2): qty 100

## 2021-07-04 MED ORDER — PROPOFOL 500 MG/50ML IV EMUL
INTRAVENOUS | Status: AC
  Filled 2021-07-04: qty 50

## 2021-07-04 MED ORDER — PROPOFOL 1000 MG/100ML IV EMUL
INTRAVENOUS | Status: AC
  Filled 2021-07-04: qty 100

## 2021-07-04 MED ORDER — CHLORHEXIDINE GLUCONATE CLOTH 2 % EX PADS
6.0000 | MEDICATED_PAD | Freq: Every day | CUTANEOUS | Status: DC
  Administered 2021-07-04 – 2021-07-06 (×3): 6 via TOPICAL

## 2021-07-04 MED ORDER — POLYETHYLENE GLYCOL 3350 17 G PO PACK
17.0000 g | PACK | Freq: Every day | ORAL | Status: DC
  Administered 2021-07-04 – 2021-07-12 (×5): 17 g
  Filled 2021-07-04 (×8): qty 1

## 2021-07-04 MED ORDER — NITROGLYCERIN IN D5W 200-5 MCG/ML-% IV SOLN: 0.0000 ug/min | INTRAVENOUS | Status: DC

## 2021-07-04 MED ORDER — FENTANYL CITRATE (PF) 100 MCG/2ML IJ SOLN
INTRAMUSCULAR | Status: AC
  Filled 2021-07-04: qty 2

## 2021-07-04 MED ORDER — OSMOLITE 1.2 CAL PO LIQD: 1000.0000 mL | ORAL | Status: DC

## 2021-07-04 MED ORDER — SODIUM CHLORIDE 0.9 % IV SOLN
250.0000 mL | INTRAVENOUS | Status: DC
  Administered 2021-07-04 – 2021-07-05 (×2): 250 mL via INTRAVENOUS

## 2021-07-04 MED ORDER — FENTANYL CITRATE (PF) 100 MCG/2ML IJ SOLN: 50.0000 ug | INTRAMUSCULAR | Status: DC | PRN

## 2021-07-04 MED ORDER — DOCUSATE SODIUM 50 MG/5ML PO LIQD
100.0000 mg | Freq: Two times a day (BID) | ORAL | Status: DC
  Administered 2021-07-04: 100 mg
  Filled 2021-07-04: qty 10

## 2021-07-04 MED ORDER — FENTANYL CITRATE (PF) 100 MCG/2ML IJ SOLN
50.0000 ug | INTRAMUSCULAR | Status: DC | PRN
  Administered 2021-07-04 (×2): 100 ug via INTRAVENOUS
  Filled 2021-07-04: qty 2

## 2021-07-04 MED ORDER — LORAZEPAM 2 MG/ML IJ SOLN
1.0000 mg | Freq: Once | INTRAMUSCULAR | Status: DC
  Filled 2021-07-04: qty 1

## 2021-07-04 MED ORDER — FENTANYL CITRATE PF 50 MCG/ML IJ SOSY
PREFILLED_SYRINGE | INTRAMUSCULAR | Status: AC
  Filled 2021-07-04: qty 2

## 2021-07-04 MED ORDER — ENOXAPARIN SODIUM 40 MG/0.4ML IJ SOSY
40.0000 mg | PREFILLED_SYRINGE | INTRAMUSCULAR | Status: DC
  Administered 2021-07-04 – 2021-07-11 (×8): 40 mg via SUBCUTANEOUS
  Filled 2021-07-04 (×8): qty 0.4

## 2021-07-04 MED ORDER — ONDANSETRON HCL 4 MG/2ML IJ SOLN
INTRAMUSCULAR | Status: AC
  Filled 2021-07-04: qty 2

## 2021-07-04 MED ORDER — DEXMEDETOMIDINE HCL IN NACL 200 MCG/50ML IV SOLN
0.4000 ug/kg/h | INTRAVENOUS | Status: DC
  Administered 2021-07-04: 1.2 ug/kg/h via INTRAVENOUS
  Administered 2021-07-04: 0.7 ug/kg/h via INTRAVENOUS
  Administered 2021-07-04: 0.4 ug/kg/h via INTRAVENOUS
  Filled 2021-07-04 (×3): qty 50

## 2021-07-04 MED ORDER — MIDAZOLAM HCL 2 MG/2ML IJ SOLN
2.0000 mg | INTRAMUSCULAR | Status: DC | PRN
  Administered 2021-07-04 (×3): 2 mg via INTRAVENOUS
  Filled 2021-07-04 (×2): qty 2

## 2021-07-04 MED ORDER — SUCCINYLCHOLINE CHLORIDE 200 MG/10ML IV SOSY
PREFILLED_SYRINGE | INTRAVENOUS | Status: AC
  Filled 2021-07-04: qty 10

## 2021-07-04 MED ORDER — PROPOFOL 1000 MG/100ML IV EMUL: 0.0000 ug/kg/min | INTRAVENOUS | Status: DC

## 2021-07-04 MED ORDER — MIDAZOLAM HCL 2 MG/2ML IJ SOLN
2.0000 mg | INTRAMUSCULAR | Status: DC | PRN
  Administered 2021-07-04: 2 mg via INTRAVENOUS
  Filled 2021-07-04 (×3): qty 2

## 2021-07-04 NOTE — ED Provider Notes (Addendum)
  Physical Exam  BP (!) 232/146   Pulse (!) 129   Temp 97.6 F (36.4 C)   Resp 18   Ht 5\' 2"  (1.575 m)   Wt 68.9 kg   SpO2 92%   BMI 27.80 kg/m   Physical Exam  ED Course/Procedures   Clinical Course as of 07/04/21 0741  Tue Jul 04, 2021  0601 Respiratory contacted to discuss initiation of Bipap. Unable to tolerate with EMS; ativan ordered to see if this will improve tolerance. [KH]  0605 Sats dropping to 85% on NRB. Patient with persistent distress. Plan for intubation for airway protection. [KH]  0734 Sepsis activation and labs NOT initiated given that clinical presentation is presently believed secondary to acute pulmonary edema and CHF.  [KH]    Clinical Course User Index [KH] 0606, PA-C    .Critical Care Performed by: Antony Madura, MD Authorized by: Derwood Kaplan, MD   Critical care provider statement:    Critical care time (minutes):  30   Critical care was necessary to treat or prevent imminent or life-threatening deterioration of the following conditions:  Respiratory failure and circulatory failure   Critical care was time spent personally by me on the following activities:  Development of treatment plan with patient or surrogate, discussions with consultants, evaluation of patient's response to treatment, examination of patient, ordering and review of radiographic studies, pulse oximetry, re-evaluation of patient's condition and ordering and performing treatments and interventions  MDM   Assuming care of patient from Dr. Derwood Kaplan   Patient in the ED for DIB. Noted to be profoundly hypertensive and in flash pulm edema. Workup thus far shows : CXR with atypical PNA. Hypercapnic resp failure, elevated WC. Pt is intubated.  Important pending results are admission.  At the time of signout, patient's map is 160+.  Patient is on 110 mics per minute of nitroglycerin.  She is about to receive 20 mg of labetalol.  She is tachycardic.  ABG reviewed and there is  hypercapnic respiratory failure.  Spoke with patient's daughter.  She last saw patient about a week and a half ago.  She indicates that patient gets her care through [facility].  She does note that patient will inhale drugs, she is unsure if there is any crack use.    Vitals:   07/04/21 0720 07/04/21 0725  BP: (!) 239/155 (!) 232/146  Pulse: (!) 131 (!) 129  Resp: 18 18  Temp: 97.8 F (36.6 C) 97.6 F (36.4 C)  SpO2: 92% 92%     07/06/21, MD 07/04/21 0743  @8 :30 Pt's FiO2 was dropped to 60% by me, patient had desats, back up to 100%. ABG pending. Requested art line. BP dropped suddenly - discontinued the nitro gtt. Family was updated again. I spoke with critical care who will admit the patient now.     07/06/21, MD 07/04/21 1031

## 2021-07-04 NOTE — ED Triage Notes (Addendum)
Pt BIB EMS from home, woke up about 0430 fine, stood up, felt a "tug" and became SOB. Pt is on NRB. Per EMS on arrival pt was tripoding, accessory muscle usage and 65% on RA. Pt has had a cough x 5 days.

## 2021-07-04 NOTE — ED Provider Notes (Addendum)
[facility]-EMERGENCY DEPT Provider Note   CSN: [id number] Arrival date & time: 07/04/21  0548     History Chief Complaint  Patient presents with   Shortness of Breath    LEVEL 5 CAVEAT 2/2 ACUITY OF CONDITION  Ellen Simmons is a 60 y.o. female.  60 year old female with a history of hypertension, hemorrhoids presents to the emergency department for shortness of breath.  She reportedly woke up at 0430 and felt "fine", but stood up and felt a "tug" in her left flank and had acute onset of shortness of breath.  Patient had sats of 65% on room air upon EMS arrival.  Noted to be tripoding with accessory muscle usage.  Oxygen saturations improved to 99% on a nonrebreather.  She reportedly has had a cough over the past 5 days.  Denies fever.  Did use crack cocaine at 1700.  Denies hx of CHF, ACS or PCI.  The history is provided by the patient and the EMS personnel. No language interpreter was used.  Shortness of Breath     Past Medical History:  Diagnosis Date   Hemorrhoids    Hypertension     There are no problems to display for this patient.   History reviewed. No pertinent surgical history.   OB History   No obstetric history on file.     No family history on file.  Social History   Tobacco Use   Smoking status: Every Day    Types: Cigarettes  Substance Use Topics   Alcohol use: Yes    Comment: occ   Drug use: Yes    Types: Marijuana    Home Medications Prior to Admission medications   Medication Sig Start Date End Date Taking? Authorizing Provider  amoxicillin (AMOXIL) 250 MG/5ML suspension Take 20 mL 3 times daily for 3 days, then 10 mL 3 times daily for the next 7 days. 09/10/14   Geoffery Lyons, MD  Dextromethorphan-Benzocaine 5-7.5 MG LOZG Use as directed 1 lozenge in the mouth or throat once.    [provider]  esomeprazole (NEXIUM) 40 MG capsule Take 40 mg by mouth daily at 12 noon.    [provider]   HYDROcodone-acetaminophen (HYCET) 7.5-325 mg/15 ml solution Take 15 mLs by mouth every 4 (four) hours as needed for moderate pain or severe pain. 09/10/14   Geoffery Lyons, MD  lisinopril-hydrochlorothiazide (PRINZIDE,ZESTORETIC) 20-25 MG per tablet Take 1 tablet by mouth daily.      [provider]    Allergies    Patient has no known allergies.  Review of Systems   Review of Systems  Unable to perform ROS: Acuity of condition  Respiratory:  Positive for shortness of breath.     Physical Exam Updated Vital Signs BP (!) 259/162   Pulse (!) 136   Temp 97.9 F (36.6 C)   Resp 18   Ht 5\' 2"  (1.575 m)   Wt 68.9 kg   SpO2 94%   BMI 27.80 kg/m   Physical Exam Vitals and nursing note reviewed.  Constitutional:      General: She is in acute distress.     Appearance: She is ill-appearing and diaphoretic.  HENT:     Head: Normocephalic.     Right Ear: External ear normal.     Left Ear: External ear normal.     Mouth/Throat:     Mouth: Mucous membranes are moist.  Eyes:     Extraocular Movements: Extraocular movements intact.  Conjunctiva/sclera: Conjunctivae normal.  Cardiovascular:     Rate and Rhythm: Regular rhythm. Tachycardia present.     Pulses: Normal pulses.  Pulmonary:     Effort: Accessory muscle usage and respiratory distress present.     Breath sounds: No stridor. Rhonchi present. No wheezing.     Comments: Tripoding with accessory muscle usage. Sats presently 97% on NRB. Chest:     Chest wall: No tenderness.  Musculoskeletal:     Cervical back: Normal range of motion.  Skin:    General: Skin is cool.     Coloration: Skin is pale.  Neurological:     Mental Status: She is alert and oriented to person, place, and time.     Coordination: Coordination normal.     Comments: Answers questions appropriately. Moving all extremities spontaneously.    ED Results / Procedures / Treatments   Labs (all labs ordered are listed, but only abnormal results  are displayed) Labs Reviewed  CBC WITH DIFFERENTIAL/PLATELET - Abnormal; Notable for the following components:      Result Value   RBC 6.02 (*)    Hemoglobin 16.2 (*)    HCT 50.8 (*)    All other components within normal limits  BLOOD GAS, ARTERIAL - Abnormal; Notable for the following components:   pH, Arterial 7.177 (*)    pCO2 arterial 65.6 (*)    pO2, Arterial 80.6 (*)    Acid-base deficit 6.4 (*)    All other components within normal limits  RESP PANEL BY RT-PCR (FLU A&B, COVID) ARPGX2  COMPREHENSIVE METABOLIC PANEL  BRAIN NATRIURETIC PEPTIDE  RAPID URINE DRUG SCREEN, HOSP PERFORMED  MAGNESIUM  BLOOD GAS, ARTERIAL  TROPONIN I (HIGH SENSITIVITY)    EKG EKG Interpretation  Date/Time:  Tuesday July 04 2021 06:18:31 EST Ventricular Rate:  139 PR Interval:  117 QRS Duration: 113 QT Interval:  398 QTC Calculation: 606 R Axis:   39 Text Interpretation: Sinus tachycardia LAE, consider biatrial enlargement Left ventricular hypertrophy Prolonged QT interval REPOLARIZATION ABNORMALITY probably secondary to LVH When compared with ECG of 11/20/2009, HEART RATE has increased REPOLARIZATION ABNORMALITY is more pronounced Confirmed by Dione Booze ([id number]) on 07/04/2021 6:24:01 AM  Radiology DG Chest Port 1 View  Result Date: 07/04/2021 CLINICAL DATA:  60 year old female intubated. EXAM: PORTABLE CHEST 1 VIEW COMPARISON:  Portable chest 11/21/2009. FINDINGS: Portable AP supine view at 0621 hours. Endotracheal tube tip is about 14 mm above the carina in good position. Enteric tube terminates in the stomach, side hole at the gastric body. Diffuse coarse bilateral pulmonary opacity, most confluent at the left lung base. No pneumothorax or pleural effusion evident on these supine views. Mediastinal contours appear stable and within normal limits. No acute osseous abnormality identified. Negative visible bowel gas. IMPRESSION: 1. Endotracheal tube and enteric tube in good position. 2. Diffuse  coarse bilateral pulmonary opacity, most confluent at the left lung base. Top differential considerations are viral/atypical bilateral pneumonia and acute pulmonary edema. Electronically Signed   By: Odessa Fleming M.D.   On: 07/04/2021 06:41    Procedures .Critical Care Performed by: Antony Madura, PA-C Authorized by: Antony Madura, PA-C   Critical care provider statement:    Critical care time (minutes):  45   Critical care was necessary to treat or prevent imminent or life-threatening deterioration of the following conditions:  Respiratory failure   Critical care was time spent personally by me on the following activities:  Development of treatment plan with patient or surrogate, discussions with consultants, evaluation  of patient's response to treatment, examination of patient, ordering and review of laboratory studies, ordering and review of radiographic studies, ordering and performing treatments and interventions, pulse oximetry, re-evaluation of patient's condition, review of old charts and obtaining history from patient or surrogate   I assumed direction of critical care for this patient from another provider in my specialty: no     Care discussed with: admitting provider     Medications Ordered in ED Medications  propofol (DIPRIVAN) 1000 MG/100ML infusion (has no administration in time range)  nitroGLYCERIN 50 mg in dextrose 5 % 250 mL (0.2 mg/mL) infusion (85 mcg/min Intravenous Rate/Dose Change 07/04/21 0714)  etomidate (AMIDATE) 2 MG/ML injection (20 mg  Given 07/04/21 0615)  rocuronium bromide 100 MG/10ML SOSY (100 mg  Given 07/04/21 0646)  propofol (DIPRIVAN) 500 MG/50ML infusion (  New Bag/Given 07/04/21 0625)  furosemide (LASIX) injection 40 mg (40 mg Intravenous Given 07/04/21 2111)    ED Course  I have reviewed the triage vital signs and the nursing notes.  Pertinent labs & imaging results that were available during my care of the patient were reviewed by me and considered in my  medical decision making (see chart for details).  Clinical Course as of 07/04/21 0736  Tue Jul 04, 2021  0601 Respiratory contacted to discuss initiation of Bipap. Unable to tolerate with EMS; ativan ordered to see if this will improve tolerance. [KH]  0605 Sats dropping to 85% on NRB. Patient with persistent distress. Plan for intubation for airway protection. [KH]  0734 Sepsis activation and labs NOT initiated given that clinical presentation is presently believed secondary to acute pulmonary edema and CHF.  [KH]    Clinical Course User Index [KH] Antony Madura, PA-C   MDM Rules/Calculators/A&P                           Critically ill 60 year old female presenting to the emergency department in acute respiratory distress with concern for acute pulmonary edema/CHF exacerbation.  Presented on a nonrebreather but became hypoxic despite this.  Was unable to trial BiPAP due to acuity of condition.  Was intubated for airway protection.  Started on nitroglycerin drip.  Pending remainder of laboratory work-up, but will necessitate admission to the ICU for further management.  Consultation for admission to be discussed between intensivist and MD Preston Fleeting.   Final Clinical Impression(s) / ED Diagnoses Final diagnoses:  Acute respiratory failure with hypoxia [facility])    Rx / DC Orders ED Discharge Orders     None        Antony Madura, PA-C 07/04/21 0716    Dione Booze, MD 07/04/21 0720    Antony Madura, PA-C 07/04/21 7356    Dione Booze, MD 07/10/21 [phone]

## 2021-07-04 NOTE — Progress Notes (Signed)
Initial Nutrition Assessment  DOCUMENTATION CODES:   Not applicable  INTERVENTION:  - will order TF regimen: Osmolite 1.2 @ 45 ml/hr with 45 ml Prosource TF BID with 100 ml free water QID. - this regimen will provide 1376 kcal, 82 grams protein, and 1286 ml free water.    NUTRITION DIAGNOSIS:   Inadequate oral intake related to inability to eat as evidenced by NPO status.  GOAL:   Patient will meet greater than or equal to 90% of their needs  MONITOR:   Vent status, TF tolerance, Labs, Weight trends  REASON FOR ASSESSMENT:   Ventilator, Consult Enteral/tube feeding initiation and management  ASSESSMENT:   60 year old female with medical history of HTN and hemorrhoids. She presented to the ED via EMS overnight on 11/15 due to shortness of breath. She reported having a cough x5 days. She required intubation in the ED.  Patient discussed in rounds this AM.   Patient remains intubated and has OGT in place (gastric).   Patient's daughter is at bedside and to her knowledge no changes in appetite or eating habits PTA and no recent weight changes.  Weight today documented as 152 lb and no other weight recordings available in the chart.    Patient is currently intubated on ventilator support MV: 7.1 L/min Temp (24hrs), Avg:97.4 F (36.3 C), Min:96.5 F (35.8 C), Max:98 F (36.7 C) Propofol: none BP: 135/90 and MAP: 104  Labs reviewed; creatinine: 1.08 mg/dl, LFTs elevated.  Medications reviewed; 100 mg colace BID, 40 mg IV lasix x1 dose 11/15, 17 g miralax/day.  Drip; levo @ 2 mcg/min.     NUTRITION - FOCUSED PHYSICAL EXAM:  Completed; no muscle or fat depletions, mild pitting edema to BLE.   Diet Order:   Diet Order     None       EDUCATION NEEDS:   No education needs have been identified at this time  Skin:  Skin Assessment: Reviewed RN Assessment  Last BM:  PTA/unknown  Height:   Ht Readings from Last 1 Encounters:  07/04/21 5\' 2"  (1.575 m)     Weight:   Wt Readings from Last 1 Encounters:  07/04/21 68.9 kg     Estimated Nutritional Needs:  Kcal:  1320 kcal Protein:  83-103 grams Fluid:  >/= 1.6 L/day      07/06/21, MS, RD, LDN, CNSC Inpatient Clinical Dietitian RD pager # available in [facility]  After hours/weekend pager # available in [facility]

## 2021-07-04 NOTE — ED Provider Notes (Signed)
60 year old female comes in acutely short of breath since about 5 AM, had had a cough for the last 5 days.  On exam, she is in acute respiratory distress with audible gurgling.  She was noted to be hypoxic, even with nonrebreather mask and needed to be emergently intubated.  Procedure Name: Intubation Date/Time: 07/04/2021 6:26 AM Performed by: Dione Booze, MD Pre-anesthesia Checklist: Patient identified, Patient being monitored, Emergency Drugs available, Timeout performed and Suction available Oxygen Delivery Method: Non-rebreather mask Preoxygenation: Pre-oxygenation with 100% oxygen Induction Type: Rapid sequence Ventilation: Mask ventilation without difficulty Laryngoscope Size: Glidescope and 4 Grade View: Grade I Tube size: 7.5 mm Number of attempts: 1 Airway Equipment and Method: Rigid stylet and Video-laryngoscopy Placement Confirmation: ETT inserted through vocal cords under direct vision, CO2 detector and Breath sounds checked- equal and bilateral Secured at: 23 cm Tube secured with: ETT holder Dental Injury: Teeth and Oropharynx as per pre-operative assessment     Chest x-ray shows adequate position of ET tube, fluffy airspace disease consistent with CHF exacerbation.  She is acutely hypertensive, started on propofol for sedation, nitroglycerin for blood pressure control and also given furosemide.  Blood pressure continues to be elevated, will add labetalol to try to reduce it.  ABG showed respiratory acidosis which should correct with ongoing mechanical ventilation.  WBC is elevated and hemoglobin is also slightly elevated.  Chemistries are pending.  Critical care has been consulted, have not answered yet.  Case is signed out to Dr. Rhunette Croft.  CRITICAL CARE Performed by: Dione Booze Total critical care time: 90 minutes Critical care time was exclusive of separately billable procedures and treating other patients. Critical care was necessary to treat or prevent imminent or  life-threatening deterioration. Critical care was time spent personally by me on the following activities: development of treatment plan with patient and/or surrogate as well as nursing, discussions with consultants, evaluation of patient's response to treatment, examination of patient, obtaining history from patient or surrogate, ordering and performing treatments and interventions, ordering and review of laboratory studies, ordering and review of radiographic studies, pulse oximetry and re-evaluation of patient's condition.   Dione Booze, MD 07/04/21 [phone]

## 2021-07-04 NOTE — Progress Notes (Signed)
Assisted with transporting PT while on vent (100% Fi02) to [facility] ICU 1222- uneventful. ICU RT aware that PT has arrived.

## 2021-07-04 NOTE — ED Notes (Signed)
BP and O2 have improved.

## 2021-07-04 NOTE — H&P (Signed)
NAME:  Ellen Simmons, MRN:  [id number], DOB:  February 08, 1961, LOS: 0 ADMISSION DATE:  07/04/2021, CONSULTATION DATE:  11/15 REFERRING MD:  Kathrynn Humble, CHIEF COMPLAINT:  respiratory failure    History of Present Illness:  60 year old female with PMH as below, which is significant for HTN who presented to [facility] ED 11/15 with complaints of SOB. Acute onset of symptoms at approximately 0430 after being awoken from sleep. EMS was called and upon their arrival she was found to have oxygen saturations 65% on room air, which quickly corrected to 99% with supplemental oxygen via non-rebreather mask. Complained of cough x 5 days upon arrival to ED. Also reported using crack the evening prior. She was hypertensive raising concern for pulmonary edema. BiPAP was considered, but could not be tolerate and the patient ultimately required intubation by the EDP. She was started on nitroglycerine infusion. PCCM called for admission.   Pertinent  Medical History   has a past medical history of Hemorrhoids and Hypertension.   Significant Hospital Events: Including procedures, antibiotic start and stop dates in addition to other pertinent events   11/15 admitted with hypertensive emergency/pulm edema on vent.   Interim History / Subjective:    Objective   Blood pressure 107/67, pulse 66, temperature (!) 97 F (36.1 C), resp. rate 18, height _0  (1.575 m), weight 68.9 kg, SpO2 95 %.    Vent Mode: PRVC FiO2 (%):  [60 %-100 %] 100 % Set Rate:  [18 bmp] 18 bmp Vt Set:  [400 mL] 400 mL PEEP:  [5 cmH20] 5 cmH20 Plateau Pressure:  [20 cmH20] 20 cmH20  No intake or output data in the 24 hours ending 07/04/21 0815 Filed Weights   07/04/21 0557  Weight: 68.9 kg    Examination: General: Elderly appearing female in NAD on vent HENT: [place]/AT, PERRL, no JVD Lungs: Coarse crackles throughout.  Cardiovascular: Tachy, regular, no MRG Abdomen: Soft, non-tender, non-distended Extremities: No acute deformity, no edema.   Neuro: Sedated with occasional agitation.    CXR reviewed 11/15. Diffuse infiltrates consistent with pulmonary edema given clinical picture.   Resolved Hospital Problem list     Assessment & Plan:   Hypertensive Emergency - SBP goal ~ 180 - 200 mmHg - Goal being met with sedation alone - at 1330 can liberalize to less than 153mHg - Will hold home lisinopril/HCTZ due to AKI (last creatinine in EMR is 0.6, now 1.1)  Acute hypoxemic respiratory failure: pulmonary edema in the setting of hypertensive emergency. - Full vent support - ABG reviewed, settings adjusted - VAP bundle - Hopefully can optimize today for weaning trials tomorrow.   AKI: - BP management as above.   Crack/Cocaine abuse - will need substance counseling when appropriate.   Leukocytosis: likely reactive - no plans for ABX at this time.  - Ongoing evaluaiton.  Best Practice (right click and "Reselect all SmartList Selections" daily)   Diet/type: NPO DVT prophylaxis: prophylactic heparin  GI prophylaxis: PPI Lines: N/A Foley:  N/A Code Status:  full code Last date of multidisciplinary goals of care discussion [  ]  Labs   CBC: Recent Labs  Lab 07/04/21 0640  WBC 17.4*  NEUTROABS 10.1*  HGB 16.2*  HCT 50.8*  MCV 84.4  PLT 2976   Basic Metabolic Panel: Recent Labs  Lab 07/04/21 0640  NA 141  K 3.7  CL 106  CO2 24  GLUCOSE 285*  BUN 19  CREATININE 1.08*  CALCIUM 8.9  MG 2.4  GFR: Estimated Creatinine Clearance: 52.3 mL/min (A) (by C-G formula based on SCr of 1.08 mg/dL (H)). Recent Labs  Lab 07/04/21 0640  WBC 17.4*    Liver Function Tests: Recent Labs  Lab 07/04/21 0640  AST 280*  ALT 191*  ALKPHOS 184*  BILITOT 0.8  PROT 8.2*  ALBUMIN 4.2   No results for input(s): LIPASE, AMYLASE in the last 168 hours. No results for input(s): AMMONIA in the last 168 hours.  ABG    Component Value Date/Time   PHART 7.177 (LL) 07/04/2021 0625   PCO2ART 65.6 (HH) 07/04/2021  0625   PO2ART 80.6 (L) 07/04/2021 0625   HCO3 23.3 07/04/2021 0625   TCO2 20 11/21/2009 0213   ACIDBASEDEF 6.4 (H) 07/04/2021 0625   O2SAT 91.5 07/04/2021 0625     Coagulation Profile: No results for input(s): INR, PROTIME in the last 168 hours.  Cardiac Enzymes: No results for input(s): CKTOTAL, CKMB, CKMBINDEX, TROPONINI in the last 168 hours.  HbA1C: No results found for: HGBA1C  CBG: No results for input(s): GLUCAP in the last 168 hours.  Review of Systems:   Patient is encephalopathic and/or intubated. Therefore history has been obtained from chart review.   Past Medical History:  She,  has a past medical history of Hemorrhoids and Hypertension.   Surgical History:  History reviewed. No pertinent surgical history.   Social History:   reports that she has been smoking cigarettes. She does not have any smokeless tobacco history on file. She reports current alcohol use. She reports current drug use. Drug: Marijuana.   Family History:  Her family history is not on file.   Allergies No Known Allergies   Home Medications  Prior to Admission medications   Medication Sig Start Date End Date Taking? Authorizing Provider  acetaminophen (TYLENOL) 500 MG tablet Take 1,000 mg by mouth every 6 (six) hours as needed for mild pain.   Yes [provider]  amoxicillin (AMOXIL) 250 MG/5ML suspension Take 20 mL 3 times daily for 3 days, then 10 mL 3 times daily for the next 7 days. 09/10/14   Veryl Speak, MD  Dextromethorphan-Benzocaine 5-7.5 MG LOZG Use as directed 1 lozenge in the mouth or throat once.    [provider]  esomeprazole (NEXIUM) 40 MG capsule Take 40 mg by mouth daily at 12 noon.    [provider]  HYDROcodone-acetaminophen (HYCET) 7.5-325 mg/15 ml solution Take 15 mLs by mouth every 4 (four) hours as needed for moderate pain or severe pain. 09/10/14   Veryl Speak, MD  lisinopril-hydrochlorothiazide (PRINZIDE,ZESTORETIC) 20-25 MG per  tablet Take 1 tablet by mouth daily.      [provider]     Critical care time: 39 minutes      Georgann Housekeeper, AGACNP-BC [place]  See Amion for personal pager PCCM on call pager [phone] until 7pm. Please call Elink 7p-7a. [phone]  07/04/2021 8:33 AM

## 2021-07-04 NOTE — ED Notes (Signed)
Family at bedside, update provided at this time.

## 2021-07-04 NOTE — Progress Notes (Signed)
IV consult placed for vasopressor US guided PIV. Per Primary RN, not needed at this time. Will monitor.

## 2021-07-04 NOTE — Progress Notes (Signed)
Notified Lab [phone]) that ABG being sent for analysis.

## 2021-07-04 NOTE — ED Notes (Signed)
O2 dropped around 79 %, o2 increased. Bp also dropped to 86/60. Nitro and propofol paused. Dr. Rhunette Croft informed and he reassessed patient.

## 2021-07-04 NOTE — ED Notes (Signed)
Date and time results received: 07/04/21 0644 (use smartphrase ".now" to insert current time)  Test: ABG ph / pCO2  Critical Value: pH 7.177 / pCO2 65.6  Name of Provider Notified: Preston Fleeting, MD  Orders Received? Or Actions Taken?:  n/a

## 2021-07-05 ENCOUNTER — Inpatient Hospital Stay (HOSPITAL_COMMUNITY): Payer: 59

## 2021-07-05 DIAGNOSIS — R0609 Other forms of dyspnea: Secondary | ICD-10-CM

## 2021-07-05 DIAGNOSIS — J9601 Acute respiratory failure with hypoxia: Secondary | ICD-10-CM | POA: Diagnosis not present

## 2021-07-05 LAB — CBC
HCT: 42.6 % (ref 36.0–46.0)
Hemoglobin: 13.8 g/dL (ref 12.0–15.0)
MCH: 27 pg (ref 26.0–34.0)
MCHC: 32.4 g/dL (ref 30.0–36.0)
MCV: 83.2 fL (ref 80.0–100.0)
Platelets: 251 10*3/uL (ref 150–400)
RBC: 5.12 MIL/uL — ABNORMAL HIGH (ref 3.87–5.11)
RDW: 15.2 % (ref 11.5–15.5)
WBC: 13.2 10*3/uL — ABNORMAL HIGH (ref 4.0–10.5)
nRBC: 0 % (ref 0.0–0.2)

## 2021-07-05 LAB — BASIC METABOLIC PANEL
Anion gap: 9 (ref 5–15)
BUN: 29 mg/dL — ABNORMAL HIGH (ref 6–20)
CO2: 24 mmol/L (ref 22–32)
Calcium: 8.8 mg/dL — ABNORMAL LOW (ref 8.9–10.3)
Chloride: 108 mmol/L (ref 98–111)
Creatinine, Ser: 1.18 mg/dL — ABNORMAL HIGH (ref 0.44–1.00)
GFR, Estimated: 54 mL/min — ABNORMAL LOW (ref >60)
Glucose, Bld: 175 mg/dL — ABNORMAL HIGH (ref 70–99)
Potassium: 3.8 mmol/L (ref 3.5–5.1)
Sodium: 141 mmol/L (ref 135–145)

## 2021-07-05 LAB — PHOSPHORUS
Phosphorus: 3.8 mg/dL (ref 2.5–4.6)
Phosphorus: 3.4 mg/dL (ref 2.5–4.6)

## 2021-07-05 LAB — ECHOCARDIOGRAM COMPLETE
AR max vel: 2.48 cm2
AV Area VTI: 2.49 cm2
AV Area mean vel: 2.39 cm2
AV Mean grad: 3 mmHg
AV Peak grad: 5.2 mmHg
Ao pk vel: 1.14 m/s
Area-P 1/2: 4.06 cm2
Calc EF: 47.4 %
Height: 62 in
S' Lateral: 3.3 cm
Single Plane A2C EF: 48.6 %
Single Plane A4C EF: 47 %
Weight: 2402.13 oz

## 2021-07-05 LAB — GLUCOSE, CAPILLARY
Glucose-Capillary: 185 mg/dL — ABNORMAL HIGH (ref 70–99)
Glucose-Capillary: 144 mg/dL — ABNORMAL HIGH (ref 70–99)
Glucose-Capillary: 73 mg/dL (ref 70–99)
Glucose-Capillary: 105 mg/dL — ABNORMAL HIGH (ref 70–99)
Glucose-Capillary: 106 mg/dL — ABNORMAL HIGH (ref 70–99)

## 2021-07-05 LAB — HEMOGLOBIN A1C
Hgb A1c MFr Bld: 6.3 % — ABNORMAL HIGH (ref 4.8–5.6)
Mean Plasma Glucose: 134.11 mg/dL

## 2021-07-05 LAB — HIV ANTIBODY (ROUTINE TESTING W REFLEX): HIV Screen 4th Generation wRfx: NONREACTIVE

## 2021-07-05 LAB — TRIGLYCERIDES: Triglycerides: 80 mg/dL (ref <150)

## 2021-07-05 LAB — MAGNESIUM: Magnesium: 2 mg/dL (ref 1.7–2.4)

## 2021-07-05 MED ORDER — INSULIN ASPART 100 UNIT/ML IJ SOLN
0.0000 [IU] | Freq: Three times a day (TID) | INTRAMUSCULAR | Status: DC
  Administered 2021-07-06: 17:00:00 5 [IU] via SUBCUTANEOUS
  Administered 2021-07-08 – 2021-07-10 (×4): 2 [IU] via SUBCUTANEOUS
  Administered 2021-07-11: 3 [IU] via SUBCUTANEOUS
  Administered 2021-07-11: 2 [IU] via SUBCUTANEOUS
  Administered 2021-07-11: 3 [IU] via SUBCUTANEOUS
  Administered 2021-07-12: 2 [IU] via SUBCUTANEOUS

## 2021-07-05 MED ORDER — PANTOPRAZOLE SODIUM 40 MG PO TBEC
40.0000 mg | DELAYED_RELEASE_TABLET | Freq: Every day | ORAL | Status: DC
  Administered 2021-07-05 – 2021-07-12 (×7): 40 mg via ORAL
  Filled 2021-07-05 (×8): qty 1

## 2021-07-05 MED ORDER — LOSARTAN POTASSIUM 50 MG PO TABS
100.0000 mg | ORAL_TABLET | Freq: Every day | ORAL | Status: DC
  Administered 2021-07-05 – 2021-07-12 (×8): 100 mg via ORAL
  Filled 2021-07-05 (×8): qty 2

## 2021-07-05 MED ORDER — LIP MEDEX EX OINT
TOPICAL_OINTMENT | CUTANEOUS | Status: DC | PRN
  Filled 2021-07-05: qty 7

## 2021-07-05 MED ORDER — PAROXETINE HCL 10 MG PO TABS
10.0000 mg | ORAL_TABLET | Freq: Every day | ORAL | Status: DC
  Administered 2021-07-05 – 2021-07-08 (×4): 10 mg via ORAL
  Filled 2021-07-05 (×5): qty 1

## 2021-07-05 MED ORDER — LOSARTAN POTASSIUM 50 MG PO TABS: 100.0000 mg | ORAL_TABLET | Freq: Every day | ORAL | Status: DC

## 2021-07-05 MED ORDER — INSULIN ASPART 100 UNIT/ML IJ SOLN
1.0000 [IU] | INTRAMUSCULAR | Status: DC
Start: 2021-07-05 — End: 2021-07-05
  Administered 2021-07-05: 2 [IU] via SUBCUTANEOUS
  Administered 2021-07-05: 1 [IU] via SUBCUTANEOUS

## 2021-07-05 MED ORDER — LORATADINE 10 MG PO TABS
10.0000 mg | ORAL_TABLET | Freq: Every day | ORAL | Status: DC
  Administered 2021-07-05 – 2021-07-12 (×8): 10 mg via ORAL
  Filled 2021-07-05 (×8): qty 1

## 2021-07-05 MED ORDER — HYDROCHLOROTHIAZIDE 25 MG PO TABS
25.0000 mg | ORAL_TABLET | Freq: Every day | ORAL | Status: DC
  Administered 2021-07-05 – 2021-07-06 (×2): 25 mg via ORAL
  Filled 2021-07-05 (×2): qty 1

## 2021-07-05 NOTE — Progress Notes (Signed)
eLink Physician-Brief Progress Note Patient Name: Ellen Simmons DOB: 1960-08-24 MRN: [id number]   Date of Service  07/05/2021  HPI/Events of Note  Patient with persistent hyperglycemia on CBG monitoring, per bedside RN patient is not a known diabetic.  eICU Interventions  Hemoglobin A1c ordered, sensitive hyperglycemia scale ordered for SSI coverage, no extraneous sources of glucose beyond tube feed identified.        Thomasene Lot [name] 07/05/2021, 4:15 AM

## 2021-07-05 NOTE — Procedures (Signed)
Extubation Procedure Note  Patient Details:   Name: ACELYN BASHAM DOB: [birth year] MRN: [id number]   Airway Documentation:    Vent end date: 07/05/21 Vent end time: 1152   Evaluation  O2 sats: stable throughout Complications: No apparent complications Patient did tolerate procedure well. Bilateral Breath Sounds: Clear, Diminished   Yes  Pt was extubated to 2L [place] per CCMD order. Pt was able to tolerate this well with saturations of 96%. Pt was suctioned and a positive cuff leak was heard prior to extubation. No stridor heard at this time and pt was able to speak afterwards.   Trebor Galdamez A Jarron Curley 07/05/2021, 11:53 AM

## 2021-07-05 NOTE — TOC Progression Note (Signed)
Transition of Care [facility]) - Progression Note    Patient Details  Name: Ellen Simmons MRN: [id number] Date of Birth: [birth year]  [facility] CM/SW Contact  Geni Bers, RN Phone Number: 07/05/2021, 12:33 PM  Clinical Narrative:    TOC reviewed chart. Will continue to follow for discharge needs.    Expected Discharge Plan: Home/Self Care Barriers to Discharge: No Barriers Identified  Expected Discharge Plan and Services Expected Discharge Plan: Home/Self Care       Living arrangements for the past 2 months: Single Family Home                                       Social Determinants of Health (SDOH) Interventions    Readmission Risk Interventions No flowsheet data found.

## 2021-07-05 NOTE — Progress Notes (Signed)
NAME:  Ellen Simmons, MRN:  [id number], DOB:  1960/11/08, LOS: 1 ADMISSION DATE:  07/04/2021, CONSULTATION DATE:  11/15 REFERRING MD:  Rhunette Croft, CHIEF COMPLAINT:  respiratory failure    History of Present Illness:  60 year old female with PMH as below, which is significant for HTN who presented to [facility] ED 11/15 with complaints of SOB. Acute onset of symptoms at approximately 0430 after being awoken from sleep. EMS was called and upon their arrival she was found to have oxygen saturations 65% on room air, which quickly corrected to 99% with supplemental oxygen via non-rebreather mask. Complained of cough x 5 days upon arrival to ED. Also reported using crack the evening prior. She was hypertensive raising concern for pulmonary edema. BiPAP was considered, but could not be tolerate and the patient ultimately required intubation by the EDP. She was started on nitroglycerine infusion. PCCM called for admission.   Pertinent  Medical History   has a past medical history of Hemorrhoids and Hypertension.  Significant Hospital Events: Including procedures, antibiotic start and stop dates in addition to other pertinent events   11/15 admitted with hypertensive emergency/pulm edema on vent. 11/16 No acute events overnight   Interim History / Subjective:  Alert and interactive on vent   Objective   Blood pressure 112/70, pulse 68, temperature 98.3 F (36.8 C), temperature source Axillary, resp. rate (!) 21, height 5\' 2"  (1.575 m), weight 68.1 kg, SpO2 97 %.    Vent Mode: PSV;CPAP FiO2 (%):  [30 %-80 %] 30 % Set Rate:  [18 bmp] 18 bmp Vt Set:  [400 mL] 400 mL PEEP:  [8 cmH20] 8 cmH20 Pressure Support:  [10 cmH20] 10 cmH20 Plateau Pressure:  [16 cmH20-19 cmH20] 19 cmH20   Intake/Output Summary (Last 24 hours) at 07/05/2021 0909 Last data filed at 07/05/2021 0724 Gross per 24 hour  Intake 1018.23 ml  Output 685 ml  Net 333.23 ml   Filed Weights   07/04/21 0557 07/05/21 0500  Weight:  68.9 kg 68.1 kg    Examination: General: Acute on chronically ill appearing middle aged female on mechanical ventilation, in NAD HEENT: ETT, MM pink/moist, PERRL,  Neuro: Will awake to verbal stimuli on vent  CV: s1s2 regular rate and rhythm, no murmur, rubs, or gallops,  PULM:  Clear to ascultation bilaterally, no increased work of breathing  GI: soft, bowel sounds active in all 4 quadrants, non-tender, non-distended Extremities: warm/dry, no edema  Skin: no rashes or lesions  Resolved Hospital Problem list     Assessment & Plan:   Hypertensive Emergency -Presented with BP 233/125 -Required brief nitro drip  -Home medications include HCTZ and Cozaar P: BP lowered with sedation and required short pressor support SBP goal less than 160 Hold home medications  Continuous telemetry   Acute hypoxemic respiratory failure -Pulmonary edema in the setting of hypertensive emergency. P: Continue ventilator support with lung protective strategies  Tolerating SBT can likely extubate this am  Wean PEEP and FiO2 for sats greater than 90%. Head of bed elevated 30 degrees. Plateau pressures less than 30 cm H20.  Follow intermittent chest x-ray and ABG.   SAT/SBT as tolerated, mentation preclude extubation  Ensure adequate pulmonary hygiene  Follow cultures  VAP bundle in place  PAD protocol  Mild Acute Kidney Injury  -in the setting of hypertensive emergency. Baseline 0.68 09/10/2014, creatinine currently 1.18 P: Follow renal function  Monitor urine output Trend Bmet Avoid nephrotoxins Ensure adequate renal perfusion   Crack/Cocaine abuse P:  Cessation education education when appropriate   Leukocytosis -Likely reactive, downtrended to 13.2 P: Trend CBC and fever curve   Best Practice (right click and "Reselect all SmartList Selections" daily)   Diet/type: NPO DVT prophylaxis: prophylactic heparin  GI prophylaxis: PPI Lines: N/A Foley:  N/A Code Status:  full  code Last date of multidisciplinary goals of care discussion: Pending   Critical care time:    Performed by: Adrianne Shackleton D. [name]  Total critical care time: 40 minutes  Critical care time was exclusive of separately billable procedures and treating other patients.  Critical care was necessary to treat or prevent imminent or life-threatening deterioration.  Critical care was time spent personally by me on the following activities: development of treatment plan with patient and/or surrogate as well as nursing, discussions with consultants, evaluation of patient's response to treatment, examination of patient, obtaining history from patient or surrogate, ordering and performing treatments and interventions, ordering and review of laboratory studies, ordering and review of radiographic studies, pulse oximetry and re-evaluation of patient's condition.  Levon Penning D. Tiburcio Pea, NP-C [facility] Personal contact information can be found on Amion  07/05/2021, 9:17 AM

## 2021-07-05 NOTE — Progress Notes (Signed)
Pt transferred from ICU to room 11.  Verbalizes no c/o discomfort only requesting items to bathe which were given to her.  Pt's daughter to bathe pt.

## 2021-07-06 LAB — GLUCOSE, CAPILLARY
Glucose-Capillary: 109 mg/dL — ABNORMAL HIGH (ref 70–99)
Glucose-Capillary: 95 mg/dL (ref 70–99)
Glucose-Capillary: 150 mg/dL — ABNORMAL HIGH (ref 70–99)
Glucose-Capillary: 166 mg/dL — ABNORMAL HIGH (ref 70–99)

## 2021-07-06 MED ORDER — FUROSEMIDE 10 MG/ML IJ SOLN
40.0000 mg | Freq: Once | INTRAMUSCULAR | Status: AC
  Administered 2021-07-06: 10:00:00 40 mg via INTRAVENOUS
  Filled 2021-07-06: qty 4

## 2021-07-06 MED ORDER — ACETAMINOPHEN 325 MG PO TABS
650.0000 mg | ORAL_TABLET | Freq: Four times a day (QID) | ORAL | Status: DC | PRN
  Administered 2021-07-06 – 2021-07-11 (×8): 650 mg via ORAL
  Filled 2021-07-06 (×8): qty 2

## 2021-07-06 MED ORDER — HYDROCHLOROTHIAZIDE 25 MG PO TABS
25.0000 mg | ORAL_TABLET | ORAL | Status: AC
  Administered 2021-07-06: 12:00:00 25 mg via ORAL
  Filled 2021-07-06: qty 1

## 2021-07-06 MED ORDER — HYDRALAZINE HCL 10 MG PO TABS
10.0000 mg | ORAL_TABLET | Freq: Three times a day (TID) | ORAL | Status: DC
  Administered 2021-07-06: 15:00:00 10 mg via ORAL
  Filled 2021-07-06 (×2): qty 1

## 2021-07-06 MED ORDER — LORAZEPAM 2 MG/ML IJ SOLN
1.0000 mg | Freq: Once | INTRAMUSCULAR | Status: AC
Start: 2021-07-06 — End: 2021-07-06
  Administered 2021-07-06: 18:00:00 1 mg via INTRAVENOUS
  Filled 2021-07-06: qty 1

## 2021-07-06 MED ORDER — HYDRALAZINE HCL 25 MG PO TABS
25.0000 mg | ORAL_TABLET | Freq: Three times a day (TID) | ORAL | Status: DC
  Administered 2021-07-06 – 2021-07-08 (×6): 25 mg via ORAL
  Filled 2021-07-06 (×7): qty 1

## 2021-07-06 MED ORDER — HYDROCHLOROTHIAZIDE 25 MG PO TABS
50.0000 mg | ORAL_TABLET | Freq: Every day | ORAL | Status: DC
  Administered 2021-07-07 – 2021-07-12 (×6): 50 mg via ORAL
  Filled 2021-07-06 (×7): qty 2

## 2021-07-06 MED ORDER — HYDRALAZINE HCL 20 MG/ML IJ SOLN
5.0000 mg | Freq: Four times a day (QID) | INTRAMUSCULAR | Status: DC | PRN
  Administered 2021-07-06 – 2021-07-11 (×4): 5 mg via INTRAVENOUS
  Filled 2021-07-06 (×5): qty 1

## 2021-07-06 NOTE — Progress Notes (Addendum)
PROGRESS NOTE  Ellen Simmons PRX:[id number] DOB: 1960/10/07 DOA: 07/04/2021 PCP: [name], NP  HPI/Recap of past 19 hours: 60 year old woman with PMH HTN, cocaine abuse, who presented with worsening shortness of breath.  Upon presentation to the ED she was in hypertensive emergency with associated flash pulmonary edema.  UDS +cocaine +THC.  She was subsequently intubated in the emergency room.  Extubated on 07/05/21 to 2L [place].  BP improved after restarting home oral antihypertensives.  TRH assumed care on 07/06/21.  07/06/21:  Reports polyuria after starting IV lasix.  Denies chest pain, breathing is improved.  BP not at goal, elevated.  Added po hydralazine.  Counseled on the importance of polysubstance cessation, she was receptive.   Assessment/Plan: Active Problems:   Acute respiratory failure with hypoxia (HCC)   Hypertensive emergency   Substance abuse ([place])   AKI (acute kidney injury) ([place])  Acute hypoxic hypercarbic respiratory failure secondary to flash pulmonary edema in the setting of hypertensive emergency. Not on oxygen supplementation at baseline. Presented with dyspnea, hypoxia, with severely elevated blood pressures. Personally reviewed chest x-ray done on admission which showed pulmonary edema.  Subsequently had to be intubated in the ED and transferred to the ICU. She was extubated on 11 1622 to 2 L nasal cannula. Continue diuresing She was restarted on her home blood pressure medications O2 saturation 97% on room air on 07/06/2021.  Hypertensive emergency with flash pulmonary edema BP is currently not at goal Increase home dose of hydrochlorothiazide to 50 mg daily On losartan 100 mg daily Added hydralazine 10 mg 3 times daily. IV antihypertensive as needed with parameters. 1 dose of IV Lasix given this morning. Continue to closely monitor vital signs.  Acute systolic CHF in the setting of cocaine abuse 2D echo done on 07/05/21 LVEF 45-50%, LV  global hypokinesis Strict I&O and daily weight Ongoing diuresing  Type 2 diabetes with hyperglycemia Obtain hemoglobin A1c Continue insulin sliding scale.  CKD 2 Baseline creatinine appears to be 1 with GFR 60. Creatinine this morning 1.18 with GFR 54. Avoid nephrotoxic agents Closely monitor urine output Closely monitor renal function while on diuretics.  Polysubstance abuse including cocaine and THC UDS positive for cocaine on UDS on 07/04/2021 Polysubstance cessation counseling done at bedside. She is receptive to cessation. TOC consulted to assist with providing resources.  Resolved toxic encephalopathy in the setting of cocaine THC abuse Mentation is at baseline   Code Status: Full code  Family Communication: None at bedside  Disposition Plan: Likely will discharge to home once blood pressure is controlled.   Consultants: PCCM  Procedures: Intubation Extubation  Antimicrobials: None  DVT prophylaxis: Subcu Lovenox daily  Status is: Inpatient  Inpatient.  Patient will require at least 2 midnights for further evaluation and treatment of present condition.      Objective: Vitals:   07/06/21 1023 07/06/21 1024 07/06/21 1135 07/06/21 1422  BP: (!) 195/113  (!) 190/91 (!) 220/101  Pulse: 74 75 70 68  Resp: 20   20  Temp: 98.3 F (36.8 C)   98 F (36.7 C)  TempSrc: Oral   Oral  SpO2: 99% 99%  97%  Weight:      Height:        Intake/Output Summary (Last 24 hours) at 07/06/2021 1433 Last data filed at 07/05/2021 1815 Gross per 24 hour  Intake 480 ml  Output --  Net 480 ml   Filed Weights   07/04/21 0557 07/05/21 0500  Weight: 68.9 kg  68.1 kg    Exam:  General: 60 y.o. year-old female well developed well nourished in no acute distress.  Alert and oriented x3. Cardiovascular: Regular rate and rhythm with no rubs or gallops.  No thyromegaly or JVD noted.   Respiratory: Mild rales at bases, mild wheezing noted. Good inspiratory  effort. Abdomen: Soft nontender nondistended with normal bowel sounds x4 quadrants. Musculoskeletal: No lower extremity edema. 2/4 pulses in all 4 extremities. Skin: No ulcerative lesions noted or rashes, Psychiatry: Mood is appropriate for condition and setting   Data Reviewed: CBC: Recent Labs  Lab 07/04/21 0640 07/05/21 0233  WBC 17.4* 13.2*  NEUTROABS 10.1*  --   HGB 16.2* 13.8  HCT 50.8* 42.6  MCV 84.4 83.2  PLT 268 004   Basic Metabolic Panel: Recent Labs  Lab 07/04/21 0640 07/04/21 1656 07/05/21 0233 07/05/21 1556  NA 141  --  141  --   K 3.7  --  3.8  --   CL 106  --  108  --   CO2 24  --  24  --   GLUCOSE 285*  --  175*  --   BUN 19  --  29*  --   CREATININE 1.08*  --  1.18*  --   CALCIUM 8.9  --  8.8*  --   MG 2.4  --  2.0  --   PHOS 5.5* 4.7* 3.8 3.4   GFR: Estimated Creatinine Clearance: 47.6 mL/min (A) (by C-G formula based on SCr of 1.18 mg/dL (H)). Liver Function Tests: Recent Labs  Lab 07/04/21 0640  AST 280*  ALT 191*  ALKPHOS 184*  BILITOT 0.8  PROT 8.2*  ALBUMIN 4.2   No results for input(s): LIPASE, AMYLASE in the last 168 hours. No results for input(s): AMMONIA in the last 168 hours. Coagulation Profile: No results for input(s): INR, PROTIME in the last 168 hours. Cardiac Enzymes: No results for input(s): CKTOTAL, CKMB, CKMBINDEX, TROPONINI in the last 168 hours. BNP (last 3 results) No results for input(s): PROBNP in the last 8760 hours. HbA1C: Recent Labs    07/05/21 0233  HGBA1C 6.3*   CBG: Recent Labs  Lab 07/05/21 1305 07/05/21 1645 07/05/21 2111 07/06/21 0738 07/06/21 1139  GLUCAP 73 105* 106* 109* 95   Lipid Profile: Recent Labs    07/05/21 0233  TRIG 80   Thyroid Function Tests: No results for input(s): TSH, T4TOTAL, FREET4, T3FREE, THYROIDAB in the last 72 hours. Anemia Panel: No results for input(s): VITAMINB12, FOLATE, FERRITIN, TIBC, IRON, RETICCTPCT in the last 72 hours. Urine analysis:     Component Value Date/Time   COLORURINE STRAW (A) 07/04/2021 0750   APPEARANCEUR HAZY (A) 07/04/2021 0750   LABSPEC 1.006 07/04/2021 0750   PHURINE 6.0 07/04/2021 0750   GLUCOSEU >=500 (A) 07/04/2021 0750   HGBUR SMALL (A) 07/04/2021 0750   BILIRUBINUR NEGATIVE 07/04/2021 0750   KETONESUR NEGATIVE 07/04/2021 0750   PROTEINUR 100 (A) 07/04/2021 0750   NITRITE NEGATIVE 07/04/2021 0750   LEUKOCYTESUR NEGATIVE 07/04/2021 0750   Sepsis Labs: @LABRCNTIP (procalcitonin:4,lacticidven:4)  ) Recent Results (from the past 240 hour(s))  Resp Panel by RT-PCR (Flu A&B, Covid) Nasopharyngeal Swab     Status: None   Collection Time: 07/04/21  6:40 AM   Specimen: Nasopharyngeal Swab; Nasopharyngeal(NP) swabs in vial transport medium  Result Value Ref Range Status   SARS Coronavirus 2 by RT PCR NEGATIVE NEGATIVE Final    Comment: (NOTE) SARS-CoV-2 target nucleic acids are NOT DETECTED.  The SARS-CoV-2  RNA is generally detectable in upper respiratory specimens during the acute phase of infection. The lowest concentration of SARS-CoV-2 viral copies this assay can detect is 138 copies/mL. A negative result does not preclude SARS-Cov-2 infection and should not be used as the sole basis for treatment or other patient management decisions. A negative result may occur with  improper specimen collection/handling, submission of specimen other than nasopharyngeal swab, presence of viral mutation(s) within the areas targeted by this assay, and inadequate number of viral copies(<138 copies/mL). A negative result must be combined with clinical observations, patient history, and epidemiological information. The expected result is Negative.  Fact Sheet for Patients:  EntrepreneurPulse.com.au  Fact Sheet for Healthcare Providers:  IncredibleEmployment.be  This test is no t yet approved or cleared by the [place] FDA and  has been authorized for detection and/or  diagnosis of SARS-CoV-2 by FDA under an Emergency Use Authorization (EUA). This EUA will remain  in effect (meaning this test can be used) for the duration of the COVID-19 declaration under Section 564(b)(1) of the Act, 21 U.S.C.section 360bbb-3(b)(1), unless the authorization is terminated  or revoked sooner.       Influenza A by PCR NEGATIVE NEGATIVE Final   Influenza B by PCR NEGATIVE NEGATIVE Final    Comment: (NOTE) The Xpert Xpress SARS-CoV-2/FLU/RSV plus assay is intended as an aid in the diagnosis of influenza from Nasopharyngeal swab specimens and should not be used as a sole basis for treatment. Nasal washings and aspirates are unacceptable for Xpert Xpress SARS-CoV-2/FLU/RSV testing.  Fact Sheet for Patients: EntrepreneurPulse.com.au  Fact Sheet for Healthcare Providers: IncredibleEmployment.be  This test is not yet approved or cleared by the [place] FDA and has been authorized for detection and/or diagnosis of SARS-CoV-2 by FDA under an Emergency Use Authorization (EUA). This EUA will remain in effect (meaning this test can be used) for the duration of the COVID-19 declaration under Section 564(b)(1) of the Act, 21 U.S.C. section 360bbb-3(b)(1), unless the authorization is terminated or revoked.  Performed at [facility], [place] [address] [place], [place] [address]   MRSA Next Gen by PCR, Nasal     Status: None   Collection Time: 07/04/21  8:53 AM   Specimen: Nasal Mucosa; Nasal Swab  Result Value Ref Range Status   MRSA by PCR Next Gen NOT DETECTED NOT DETECTED Final    Comment: (NOTE) The GeneXpert MRSA Assay (FDA approved for NASAL specimens only), is one component of a comprehensive MRSA colonization surveillance program. It is not intended to diagnose MRSA infection nor to guide or monitor treatment for MRSA infections. Test performance is not FDA approved in patients less than 63  years old. Performed at [facility], [place] [address], [place], [place] [address]       Studies: ECHOCARDIOGRAM COMPLETE  Result Date: 07/05/2021    ECHOCARDIOGRAM REPORT   Patient Name:   CHANNING SAVICH Date of Exam: 07/05/2021 Medical Rec #:  [id number]         Height:       62.0 in Accession #:    [id number]        Weight:       150.1 lb Date of Birth:  06/30/1961        BSA:          1.692 m Patient Age:    57 years          BP:  136/74 mmHg Patient Gender: F                 HR:           80 bpm. Exam Location:  Inpatient Procedure: 2D Echo, Cardiac Doppler and Color Doppler Indications:    Dyspnea  History:        Patient has no prior history of Echocardiogram examinations.                 Risk Factors:Hypertension.  Sonographer:    Glo Herring Referring Phys: [place]  1. Left ventricular ejection fraction, by estimation, is 45 to 50%. The left ventricle has mildly decreased function. The left ventricle demonstrates global hypokinesis. There is mild left ventricular hypertrophy. Left ventricular diastolic parameters are consistent with Grade I diastolic dysfunction (impaired relaxation).  2. Right ventricular systolic function is normal. The right ventricular size is normal. Tricuspid regurgitation signal is inadequate for assessing PA pressure.  3. Left atrial size was mild to moderately dilated.  4. The mitral valve is normal in structure. Trivial mitral valve regurgitation. No evidence of mitral stenosis.  5. The aortic valve is tricuspid. Aortic valve regurgitation is not visualized. No aortic stenosis is present.  6. The inferior vena cava is normal in size with <50% respiratory variability, suggesting right atrial pressure of 8 mmHg. FINDINGS  Left Ventricle: Left ventricular ejection fraction, by estimation, is 45 to 50%. The left ventricle has mildly decreased function. The left ventricle demonstrates global hypokinesis. The left  ventricular internal cavity size was normal in size. There is  mild left ventricular hypertrophy. Left ventricular diastolic parameters are consistent with Grade I diastolic dysfunction (impaired relaxation). Right Ventricle: The right ventricular size is normal. No increase in right ventricular wall thickness. Right ventricular systolic function is normal. Tricuspid regurgitation signal is inadequate for assessing PA pressure. Left Atrium: Left atrial size was mild to moderately dilated. Right Atrium: Right atrial size was normal in size. Pericardium: There is no evidence of pericardial effusion. Mitral Valve: The mitral valve is normal in structure. Trivial mitral valve regurgitation. No evidence of mitral valve stenosis. Tricuspid Valve: The tricuspid valve is normal in structure. Tricuspid valve regurgitation is trivial. Aortic Valve: The aortic valve is tricuspid. Aortic valve regurgitation is not visualized. No aortic stenosis is present. Aortic valve mean gradient measures 3.0 mmHg. Aortic valve peak gradient measures 5.2 mmHg. Aortic valve area, by VTI measures 2.49 cm. Pulmonic Valve: The pulmonic valve was normal in structure. Pulmonic valve regurgitation is not visualized. Aorta: The aortic root is normal in size and structure. Venous: The inferior vena cava is normal in size with less than 50% respiratory variability, suggesting right atrial pressure of 8 mmHg. IAS/Shunts: No atrial level shunt detected by color flow Doppler.  LEFT VENTRICLE PLAX 2D LVIDd:         5.00 cm      Diastology LVIDs:         3.30 cm      LV e' medial:    6.20 cm/s LV PW:         1.30 cm      LV E/e' medial:  13.5 LV IVS:        1.30 cm      LV e' lateral:   7.07 cm/s LVOT diam:     2.20 cm      LV E/e' lateral: 11.9 LV SV:         53 LV SV  Index:   31 LVOT Area:     3.80 cm  LV Volumes (MOD) LV vol d, MOD A2C: 140.0 ml LV vol d, MOD A4C: 115.0 ml LV vol s, MOD A2C: 71.9 ml LV vol s, MOD A4C: 61.0 ml LV SV MOD A2C:     68.1  ml LV SV MOD A4C:     115.0 ml LV SV MOD BP:      60.8 ml RIGHT VENTRICLE             IVC RV Basal diam:  3.80 cm     IVC diam: 2.00 cm RV S prime:     12.40 cm/s LEFT ATRIUM             Index        RIGHT ATRIUM           Index LA diam:        4.00 cm 2.36 cm/m   RA Area:     16.90 cm LA Vol (A2C):   86.1 ml 50.88 ml/m  RA Volume:   43.80 ml  25.88 ml/m LA Vol (A4C):   51.0 ml 30.14 ml/m LA Biplane Vol: 67.3 ml 39.77 ml/m  AORTIC VALVE                    PULMONIC VALVE AV Area (Vmax):    2.48 cm     PV Vmax:       1.03 m/s AV Area (Vmean):   2.39 cm     PV Peak grad:  4.2 mmHg AV Area (VTI):     2.49 cm AV Vmax:           114.00 cm/s AV Vmean:          79.100 cm/s AV VTI:            0.212 m AV Peak Grad:      5.2 mmHg AV Mean Grad:      3.0 mmHg LVOT Vmax:         74.40 cm/s LVOT Vmean:        49.800 cm/s LVOT VTI:          0.139 m LVOT/AV VTI ratio: 0.66  AORTA Ao Root diam: 3.00 cm Ao Asc diam:  2.90 cm MITRAL VALVE MV Area (PHT): 4.06 cm    SHUNTS MV Decel Time: 187 msec    Systemic VTI:  0.14 m MV E velocity: 84.00 cm/s  Systemic Diam: 2.20 cm MV A velocity: 97.30 cm/s MV E/A ratio:  0.86 Dalton McleanMD Electronically signed by Franki Monte Signature Date/Time: 07/05/2021/5:41:12 PM    Final     Scheduled Meds:  chlorhexidine gluconate (MEDLINE KIT)  15 mL Mouth Rinse BID   Chlorhexidine Gluconate Cloth  6 each Topical Q0600   enoxaparin (LOVENOX) injection  40 mg Subcutaneous Q24H   hydrALAZINE  10 mg Oral Q8H   [START ON 07/07/2021] hydrochlorothiazide  50 mg Oral Daily   insulin aspart  0-15 Units Subcutaneous TID WC   loratadine  10 mg Oral Daily   losartan  100 mg Oral Daily   mouth rinse  15 mL Mouth Rinse 10 times per day   pantoprazole  40 mg Oral Daily   PARoxetine  10 mg Oral Daily   polyethylene glycol  17 g Per Tube Daily    Continuous Infusions:  sodium chloride 250 mL (07/05/21 1155)     LOS: 2 days     [name],  MD [facility] Pager  [phone]  If 7PM-7AM, please contact night-coverage [email or web address] Password [facility] 07/06/2021, 2:33 PM

## 2021-07-06 NOTE — Progress Notes (Signed)
Patient noted to trigger yellow mews due to BP 220/101. Yellow mews protocol started. Notified Christena Deem, RN and Dr. Margo Aye. See new orders. Patient is alert and oriented.   07/06/21 1422  Assess: MEWS Score  Temp 98 F (36.7 C)  BP (!) 220/101  Pulse Rate 68  Resp 20  SpO2 97 %  O2 Device Room Air  Assess: MEWS Score  MEWS Temp 0  MEWS Systolic 2  MEWS Pulse 0  MEWS RR 0  MEWS LOC 0  MEWS Score 2  MEWS Score Color Yellow  Assess: if the MEWS score is Yellow or Red  Were vital signs taken at a resting state? Yes  Focused Assessment No change from prior assessment  Does the patient meet 2 or more of the SIRS criteria? No  MEWS guidelines implemented *See Row Information* Yes  Treat  MEWS Interventions Administered prn meds/treatments  Pain Scale 0-10  Pain Score 5  Pain Type Acute pain  Pain Location Head  Pain Descriptors / Indicators Headache  Pain Frequency Intermittent  Pain Onset On-going  Patients Stated Pain Goal 0  Pain Intervention(s) Rest;Relaxation  Multiple Pain Sites No  Take Vital Signs  Increase Vital Sign Frequency  Yellow: Q 2hr X 2 then Q 4hr X 2, if remains yellow, continue Q 4hrs  Escalate  MEWS: Escalate Yellow: discuss with charge nurse/RN and consider discussing with provider and RRT  Notify: Charge Nurse/RN  Name of Charge Nurse/RN Notified Christena Deem, RN  Date Charge Nurse/RN Notified 07/06/21  Time Charge Nurse/RN Notified 1432  Notify: Provider  Provider Name/Title Dr. Margo Aye  Date Provider Notified 07/06/21  Time Provider Notified 1432  Notification Type Page  Notification Reason Other (Comment) (BP 220/101)  Provider response See new orders  Date of Provider Response 07/06/21  Time of Provider Response 1432  Document  Patient Outcome Other (Comment) (remains on unit)  Progress note created (see row info) Yes  Assess: SIRS CRITERIA  SIRS Temperature  0  SIRS Pulse 0  SIRS Respirations  0  SIRS WBC 0  SIRS Score Sum  0

## 2021-07-07 DIAGNOSIS — R9430 Abnormal result of cardiovascular function study, unspecified: Secondary | ICD-10-CM

## 2021-07-07 DIAGNOSIS — I161 Hypertensive emergency: Secondary | ICD-10-CM

## 2021-07-07 LAB — GLUCOSE, CAPILLARY
Glucose-Capillary: 119 mg/dL — ABNORMAL HIGH (ref 70–99)
Glucose-Capillary: 136 mg/dL — ABNORMAL HIGH (ref 70–99)
Glucose-Capillary: 111 mg/dL — ABNORMAL HIGH (ref 70–99)
Glucose-Capillary: 137 mg/dL — ABNORMAL HIGH (ref 70–99)

## 2021-07-07 LAB — BASIC METABOLIC PANEL
Anion gap: 12 (ref 5–15)
BUN: 14 mg/dL (ref 6–20)
CO2: 28 mmol/L (ref 22–32)
Calcium: 9.5 mg/dL (ref 8.9–10.3)
Chloride: 95 mmol/L — ABNORMAL LOW (ref 98–111)
Creatinine, Ser: 0.65 mg/dL (ref 0.44–1.00)
GFR, Estimated: >60 mL/min (ref >60)
Glucose, Bld: 138 mg/dL — ABNORMAL HIGH (ref 70–99)
Potassium: 2.8 mmol/L — ABNORMAL LOW (ref 3.5–5.1)
Sodium: 135 mmol/L (ref 135–145)

## 2021-07-07 LAB — MAGNESIUM: Magnesium: 1.7 mg/dL (ref 1.7–2.4)

## 2021-07-07 MED ORDER — HYDRALAZINE HCL 20 MG/ML IJ SOLN
5.0000 mg | Freq: Once | INTRAMUSCULAR | Status: AC
Start: 2021-07-07 — End: 2021-07-07
  Administered 2021-07-07: 5 mg via INTRAVENOUS
  Filled 2021-07-07: qty 1

## 2021-07-07 MED ORDER — POTASSIUM CHLORIDE CRYS ER 20 MEQ PO TBCR
40.0000 meq | EXTENDED_RELEASE_TABLET | ORAL | Status: AC
  Administered 2021-07-07: 40 meq via ORAL
  Filled 2021-07-07: qty 2

## 2021-07-07 MED ORDER — POTASSIUM CHLORIDE CRYS ER 20 MEQ PO TBCR
40.0000 meq | EXTENDED_RELEASE_TABLET | ORAL | Status: AC
  Administered 2021-07-07 (×3): 40 meq via ORAL
  Filled 2021-07-07 (×3): qty 2

## 2021-07-07 MED ORDER — FUROSEMIDE 10 MG/ML IJ SOLN
40.0000 mg | Freq: Once | INTRAMUSCULAR | Status: AC
  Administered 2021-07-07: 40 mg via INTRAVENOUS
  Filled 2021-07-07: qty 4

## 2021-07-07 NOTE — Progress Notes (Signed)
PROGRESS NOTE  Ellen Simmons QZR:[id number] DOB: [birth year] DOA: 07/04/2021 PCP: [name], NP  HPI/Recap of past 28 hours: 60 year old woman with PMH HTN, cocaine abuse, who presented with worsening shortness of breath.  Upon presentation to the ED she was in hypertensive emergency with associated flash pulmonary edema.  UDS +cocaine +THC.  She was subsequently intubated in the emergency room.  Extubated on 07/05/21 to 2L [place].  BP improved after restarting home oral antihypertensives.  TRH assumed care on 07/06/21.  On HCTZ, losartan, and hydralazine for blood pressure control.  She was seen by cardiology, provided recommendations, signed off.  Recommended follow-up outpatient in 3 months we will repeat a 2D echo.  Patient has been advised and counseled on the importance of polysubstance abuse cessation, she was receptive.  07/07/21: Denies any chest pain.  Dyspnea is improved.  Has a headache this morning with uncontrolled hypertension.   Assessment/Plan: Active Problems:   Acute respiratory failure with hypoxia (HCC)   Hypertensive emergency   Substance abuse ([place])   AKI (acute kidney injury) ([place])  Acute hypoxic hypercarbic respiratory failure secondary to flash pulmonary edema in the setting of hypertensive emergency. Not on oxygen supplementation at baseline. Presented with dyspnea, hypoxia, with severely elevated blood pressures and flash pulmonary edema on chest x-ray. Subsequently intubated in the ED and transferred to the ICU. She was extubated on 07/05/21 to 2 L nasal cannula. Ongoing diuresing, control hypertension.  Hypertensive emergency with flash pulmonary edema P.o. antihypertensives have been titrated.   Increased home dose of hydrochlorothiazide to 50 mg daily On losartan 100 mg daily Increase hydralazine dose to 25 mg 3 times daily.   IV Lasix 40 mg x 1. IV antihypertensive as needed with parameters. Continue to closely monitor bowel  sounds.  Acute systolic CHF in the setting of cocaine abuse 2D echo done on 07/05/21 LVEF 45-50%, LV global hypokinesis Seen by cardiology, recommended repeat 2D echo in 3 months, to avoid cocaine. Continue strict I&O and daily weight Ongoing diuresing  Type 2 diabetes with hyperglycemia Hemoglobin A1c 6.3 on 07/05/2021 Continue insulin sliding scale.  Resolved AKI Baseline creatinine appears to be 0.6 with a GFR of greater than 60 Creatinine back to baseline from 1.18 with GFR 54. Continue to avoid nephrotoxic agents. Monitor urine output with strict I's and O's  Polysubstance abuse including cocaine and THC UDS positive for cocaine on UDS on 07/04/2021 Polysubstance cessation counseling done at bedside. She is receptive to cessation. TOC consulted to assist with providing resources.  Resolved toxic encephalopathy in the setting of cocaine THC abuse Mentation is at baseline   Code Status: Full code  Family Communication: None at bedside  Disposition Plan: Likely will discharge to home once blood pressure is controlled, likely on 07/08/2021.   Consultants: PCCM Cardiology, signed off.  Procedures: Intubation Extubation  Antimicrobials: None  DVT prophylaxis: Subcu Lovenox daily  Status is: Inpatient  Inpatient.  Patient will require at least 2 midnights for further evaluation and treatment of present condition.      Objective: Vitals:   07/06/21 2244 07/07/21 0219 07/07/21 0500 07/07/21 0505  BP: (!) 171/81 (!) 176/98  (!) 167/89  Pulse: 67 68  70  Resp:  16    Temp:  98.2 F (36.8 C)    TempSrc:  Oral    SpO2:  97%    Weight:   73.2 kg   Height:        Intake/Output Summary (Last 24 hours) at 07/07/2021  1751 Last data filed at 07/07/2021 0900 Gross per 24 hour  Intake 360 ml  Output --  Net 360 ml   Filed Weights   07/04/21 0557 07/05/21 0500 07/07/21 0500  Weight: 68.9 kg 68.1 kg 73.2 kg    Exam:  General: 60 y.o. year-old female  well-developed well-nourished in no acute distress.  She is alert and oriented x3. Cardiovascular: Regular rate and rhythm no rubs or gallops.  Respiratory: Clear without additional wheezes or rales.  Abdomen: Soft noted normal bowel sounds presented with  musculoskeletal: No lower extremity edema bilaterally.   Skin: No ulcerative lesions noted.   Psychiatry: Mood is appropriate for condition stable.   Data Reviewed: CBC: Recent Labs  Lab 07/04/21 0640 07/05/21 0233  WBC 17.4* 13.2*  NEUTROABS 10.1*  --   HGB 16.2* 13.8  HCT 50.8* 42.6  MCV 84.4 83.2  PLT 268 161   Basic Metabolic Panel: Recent Labs  Lab 07/04/21 0640 07/04/21 1656 07/05/21 0233 07/05/21 1556 07/07/21 0502  NA 141  --  141  --  135  K 3.7  --  3.8  --  2.8*  CL 106  --  108  --  95*  CO2 24  --  24  --  28  GLUCOSE 285*  --  175*  --  138*  BUN 19  --  29*  --  14  CREATININE 1.08*  --  1.18*  --  0.65  CALCIUM 8.9  --  8.8*  --  9.5  MG 2.4  --  2.0  --  1.7  PHOS 5.5* 4.7* 3.8 3.4  --    GFR: Estimated Creatinine Clearance: 72.6 mL/min (by C-G formula based on SCr of 0.65 mg/dL). Liver Function Tests: Recent Labs  Lab 07/04/21 0640  AST 280*  ALT 191*  ALKPHOS 184*  BILITOT 0.8  PROT 8.2*  ALBUMIN 4.2   No results for input(s): LIPASE, AMYLASE in the last 168 hours. No results for input(s): AMMONIA in the last 168 hours. Coagulation Profile: No results for input(s): INR, PROTIME in the last 168 hours. Cardiac Enzymes: No results for input(s): CKTOTAL, CKMB, CKMBINDEX, TROPONINI in the last 168 hours. BNP (last 3 results) No results for input(s): PROBNP in the last 8760 hours. HbA1C: Recent Labs    07/05/21 0233  HGBA1C 6.3*   CBG: Recent Labs  Lab 07/06/21 1652 07/06/21 2208 07/07/21 0737 07/07/21 1132 07/07/21 1656  GLUCAP 150* 166* 119* 136* 111*   Lipid Profile: Recent Labs    07/05/21 0233  TRIG 80   Thyroid Function Tests: No results for input(s): TSH,  T4TOTAL, FREET4, T3FREE, THYROIDAB in the last 72 hours. Anemia Panel: No results for input(s): VITAMINB12, FOLATE, FERRITIN, TIBC, IRON, RETICCTPCT in the last 72 hours. Urine analysis:    Component Value Date/Time   COLORURINE STRAW (A) 07/04/2021 0750   APPEARANCEUR HAZY (A) 07/04/2021 0750   LABSPEC 1.006 07/04/2021 0750   PHURINE 6.0 07/04/2021 0750   GLUCOSEU >=500 (A) 07/04/2021 0750   HGBUR SMALL (A) 07/04/2021 0750   BILIRUBINUR NEGATIVE 07/04/2021 0750   KETONESUR NEGATIVE 07/04/2021 0750   PROTEINUR 100 (A) 07/04/2021 0750   NITRITE NEGATIVE 07/04/2021 0750   LEUKOCYTESUR NEGATIVE 07/04/2021 0750   Sepsis Labs: @LABRCNTIP (procalcitonin:4,lacticidven:4)  ) Recent Results (from the past 240 hour(s))  Resp Panel by RT-PCR (Flu A&B, Covid) Nasopharyngeal Swab     Status: None   Collection Time: 07/04/21  6:40 AM   Specimen: Nasopharyngeal Swab; Nasopharyngeal(NP) swabs  in vial transport medium  Result Value Ref Range Status   SARS Coronavirus 2 by RT PCR NEGATIVE NEGATIVE Final    Comment: (NOTE) SARS-CoV-2 target nucleic acids are NOT DETECTED.  The SARS-CoV-2 RNA is generally detectable in upper respiratory specimens during the acute phase of infection. The lowest concentration of SARS-CoV-2 viral copies this assay can detect is 138 copies/mL. A negative result does not preclude SARS-Cov-2 infection and should not be used as the sole basis for treatment or other patient management decisions. A negative result may occur with  improper specimen collection/handling, submission of specimen other than nasopharyngeal swab, presence of viral mutation(s) within the areas targeted by this assay, and inadequate number of viral copies(<138 copies/mL). A negative result must be combined with clinical observations, patient history, and epidemiological information. The expected result is Negative.  Fact Sheet for Patients:  EntrepreneurPulse.com.au  Fact  Sheet for Healthcare Providers:  IncredibleEmployment.be  This test is no t yet approved or cleared by the [place] FDA and  has been authorized for detection and/or diagnosis of SARS-CoV-2 by FDA under an Emergency Use Authorization (EUA). This EUA will remain  in effect (meaning this test can be used) for the duration of the COVID-19 declaration under Section 564(b)(1) of the Act, 21 U.S.C.section 360bbb-3(b)(1), unless the authorization is terminated  or revoked sooner.       Influenza A by PCR NEGATIVE NEGATIVE Final   Influenza B by PCR NEGATIVE NEGATIVE Final    Comment: (NOTE) The Xpert Xpress SARS-CoV-2/FLU/RSV plus assay is intended as an aid in the diagnosis of influenza from Nasopharyngeal swab specimens and should not be used as a sole basis for treatment. Nasal washings and aspirates are unacceptable for Xpert Xpress SARS-CoV-2/FLU/RSV testing.  Fact Sheet for Patients: EntrepreneurPulse.com.au  Fact Sheet for Healthcare Providers: IncredibleEmployment.be  This test is not yet approved or cleared by the [place] FDA and has been authorized for detection and/or diagnosis of SARS-CoV-2 by FDA under an Emergency Use Authorization (EUA). This EUA will remain in effect (meaning this test can be used) for the duration of the COVID-19 declaration under Section 564(b)(1) of the Act, 21 U.S.C. section 360bbb-3(b)(1), unless the authorization is terminated or revoked.  Performed at [facility], [place] [address], [place], [place] [address]   MRSA Next Gen by PCR, Nasal     Status: None   Collection Time: 07/04/21  8:53 AM   Specimen: Nasal Mucosa; Nasal Swab  Result Value Ref Range Status   MRSA by PCR Next Gen NOT DETECTED NOT DETECTED Final    Comment: (NOTE) The GeneXpert MRSA Assay (FDA approved for NASAL specimens only), is one component of a comprehensive MRSA colonization  surveillance program. It is not intended to diagnose MRSA infection nor to guide or monitor treatment for MRSA infections. Test performance is not FDA approved in patients less than 27 years old. Performed at [facility], [place] [address], [place], [place] [address]       Studies: No results found.  Scheduled Meds:  chlorhexidine gluconate (MEDLINE KIT)  15 mL Mouth Rinse BID   Chlorhexidine Gluconate Cloth  6 each Topical Q0600   enoxaparin (LOVENOX) injection  40 mg Subcutaneous Q24H   hydrALAZINE  25 mg Oral Q8H   hydrochlorothiazide  50 mg Oral Daily   insulin aspart  0-15 Units Subcutaneous TID WC   loratadine  10 mg Oral Daily   losartan  100 mg Oral Daily   mouth rinse  15 mL Mouth Rinse 10 times per day   pantoprazole  40 mg Oral Daily   PARoxetine  10 mg Oral Daily   polyethylene glycol  17 g Per Tube Daily   potassium chloride  40 mEq Oral Q4H    Continuous Infusions:  sodium chloride 250 mL (07/05/21 1155)     LOS: 3 days     [name], MD [facility] Pager [phone]  If 7PM-7AM, please contact night-coverage [email or web address] Password [facility] 07/07/2021, 5:51 PM

## 2021-07-07 NOTE — TOC Initial Note (Signed)
Transition of Care [facility]) - Initial/Assessment Note    Patient Details  Name: Ellen Simmons MRN: [id number] Date of Birth: [birth year]  Transition of [facility] CM/SW Contact:    Ida Rogue, LCSW Phone Number: 07/07/2021, 11:16 AM  Clinical Narrative:   Patient seen in follow up to MD consult for SA resources.  Ellen Simmons readily admits to using crack cocaine for past 3 years or so, prior to that was clean and sober for 10 years.  Attributes her sobriety to living with daughter and avoiding drug using people. 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  She also works part time at [employer], does not want to move back in with her daughter even though she says that would be a possibility.  She is invested in sobriety, does not want to go to a 28 day program, but is interested in [place] and possibly an [employer].  Gave her list of [place], SAIOP resources placed in in AVS. TOC will continue to follow during the course of hospitalization.                 Expected Discharge Plan: Home/Self Care Barriers to Discharge: No Barriers Identified   Patient Goals and CMS Choice Patient states their goals for this hospitalization and ongoing recovery are:: To get better CMS Medicare.gov Compare Post Acute Care list provided to:: Patient Choice offered to / list presented to : Patient  Expected Discharge Plan and Services Expected Discharge Plan: Home/Self Care In-house Referral: Clinical Social Work     Living arrangements for the past 2 months: Single Family Home                                      Prior Living Arrangements/Services Living arrangements for the past 2 months: Single Family Home Lives with:: Friends Patient language and need for interpreter reviewed:: Yes Do you feel safe going back to the place where you live?: Yes      Need for Family Participation in  Patient Care: Yes (Comment) Care giver support system in place?: Yes (comment)   Criminal Activity/Legal Involvement Pertinent to Current Situation/Hospitalization: No - Comment as needed  Activities of Daily Living Home Assistive Devices/Equipment: None ADL Screening (condition at time of admission) Patient's cognitive ability adequate to safely complete daily activities?: No (currently intubated) Is the patient deaf or have difficulty hearing?: No Does the patient have difficulty seeing, even when wearing glasses/contacts?: No Does the patient have difficulty concentrating, remembering, or making decisions?: Yes (currently intubated) Patient able to express need for assistance with ADLs?: No (currently intubated) Does the patient have difficulty dressing or bathing?: Yes (currently intubated) Independently performs ADLs?: No (currently intubated) Communication: Dependent Is this a change from baseline?: Change from baseline, expected to last >3 days Dressing (OT): Dependent Is this a change from baseline?: Change from baseline, expected to last >3 days Grooming: Dependent Is this a change from baseline?: Change from baseline, expected to last >3 days Feeding: Dependent Is this a change from baseline?: Change from baseline, expected to last >3 days Bathing: Dependent Is this a change from baseline?: Change from baseline, expected to last >3 days Toileting: Dependent Is this a change from baseline?: Change from baseline, expected to last >3days In/Out Bed: Dependent Is this a change  from baseline?: Change from baseline, expected to last >3 days Walks in Home: Dependent Is this a change from baseline?: Change from baseline, expected to last >3 days Does the patient have difficulty walking or climbing stairs?: Yes Weakness of Legs: Both Weakness of Arms/Hands: Both  Permission Sought/Granted                  Emotional Assessment Appearance:: Appears stated  age Attitude/Demeanor/Rapport: Engaged Affect (typically observed): Appropriate Orientation: : Oriented to Self, Oriented to Place, Oriented to  Time, Oriented to Situation Alcohol / Substance Use: Illicit Drugs Psych Involvement: No (comment)  Admission diagnosis:  Acute respiratory failure with hypoxia (HCC) [J96.01] Acute hypoxemic respiratory failure (HCC) [J96.01] Patient Active Problem List   Diagnosis Date Noted   Acute respiratory failure with hypoxia (HCC) 07/04/2021   Hypertensive emergency    Substance abuse (HCC)    AKI (acute kidney injury) (HCC)    PCP:  Placey, Chales Abrahams, NP Pharmacy:   [facility] [address]), [place] - [address] [place] ([place]) [place] [address] Phone: [phone] Fax: [phone]  [facility] - [place], [place] - [address] [place] [address] Phone: [phone] Fax: [phone]  [facility] - [place], [place] - [place] [address] [place] [address] Phone: [phone] Fax: [phone]     Social Determinants of Health (SDOH) Interventions    Readmission Risk Interventions No flowsheet data found.

## 2021-07-07 NOTE — Plan of Care (Signed)

## 2021-07-07 NOTE — Consult Note (Signed)
CARDIOLOGY CONSULT NOTE       Patient ID: Ellen Simmons MRN: [id number] DOB/AGE: 58-16-1962 58 y.o.  Admit date: 07/04/2021 Referring Physician: [name] Primary Physician: [name], NP Primary Cardiologist: New Reason for Consultation: Low EF/HTN  Active Problems:   Acute respiratory failure with hypoxia [facility])   Hypertensive emergency   Substance abuse ([place])   AKI (acute kidney injury) ([place])   HPI:  60 y.o. with history of poorly Rx HTN and cocaine abuse. Admitted with flash pulmonary edema and hypertensive Urgency Intubated Only admitted to using crack evening before She subsequently was Rx with iv HTN meds and diuretics And extubated Echo with only mildly decreased EF 45-50% mild LVH grade one diastolic dysfunction no significant valve disease She denies SSCP. Breathing much better. Currently being Rx with oral hydralazine, diuretics, and Cozaar. BNP Only 242 Troponin not really elevated 56->94 ECG ST no acute changes LVH / LAE   ROS All other systems reviewed and negative except as noted above  Past Medical History:  Diagnosis Date   Hemorrhoids    Hypertension     History reviewed. No pertinent family history.  Social History   Socioeconomic History   Marital status: Legally Separated    Spouse name: Not on file   Number of children: Not on file   Years of education: Not on file   Highest education level: Not on file  Occupational History   Not on file  Tobacco Use   Smoking status: Every Day    Types: Cigarettes   Smokeless tobacco: Never  Substance and Sexual Activity   Alcohol use: Yes    Comment: occ   Drug use: Yes    Types: Marijuana   Sexual activity: Not on file  Other Topics Concern   Not on file  Social History Narrative   Not on file   Social Determinants of Health   Financial Resource Strain: Not on file  Food Insecurity: Not on file  Transportation Needs: Not on file  Physical Activity: Not on file  Stress: Not on file   Social Connections: Not on file  Intimate Partner Violence: Not on file    History reviewed. No pertinent surgical history.    Current Facility-Administered Medications:    0.9 %  sodium chloride infusion, 250 mL, Intravenous, Continuous, [name], [name], MD, Last Rate: 10 mL/hr at 07/05/21 1155, 250 mL at 07/05/21 1155   acetaminophen (TYLENOL) tablet 650 mg, 650 mg, Oral, Q6H PRN, [name], DO, 650 mg at 07/07/21 0513   chlorhexidine gluconate (MEDLINE KIT) (PERIDEX) 0.12 % solution 15 mL, 15 mL, Mouth Rinse, BID, [name], [name], MD, 15 mL at 07/05/21 0844   Chlorhexidine Gluconate Cloth 2 % PADS 6 each, 6 each, Topical, Q0600, [name], [name], MD, 6 each at 07/06/21 0652   enoxaparin (LOVENOX) injection 40 mg, 40 mg, Subcutaneous, Q24H, [name], NP, 40 mg at 07/06/21 1717   hydrALAZINE (APRESOLINE) injection 5 mg, 5 mg, Intravenous, Q6H PRN, [name], DO, 5 mg at 07/06/21 2214   hydrALAZINE (APRESOLINE) tablet 25 mg, 25 mg, Oral, Q8H, Hall, [name], DO, 25 mg at 07/07/21 0513   hydrochlorothiazide (HYDRODIURIL) tablet 50 mg, 50 mg, Oral, Daily, Hall, [name], DO, 50 mg at 07/07/21 1044   insulin aspart (novoLOG) injection 0-15 Units, 0-15 Units, Subcutaneous, TID WC, [name], [name], NP, 5 Units at 07/06/21 1715   lip balm (CARMEX) ointment, , Topical, PRN, [name], [name], MD  loratadine (CLARITIN) tablet 10 mg, 10 mg, Oral, Daily, [name], Whitney D, NP, 10 mg at 07/07/21 1044   losartan (COZAAR) tablet 100 mg, 100 mg, Oral, Daily, [name], [name], MD, 100 mg at 07/07/21 1044   MEDLINE mouth rinse, 15 mL, Mouth Rinse, 10 times per day, [name], [name], MD, 15 mL at 07/06/21 0652   ondansetron (ZOFRAN) injection 4 mg, 4 mg, Intravenous, Q6H PRN, [name], [name], MD, 4 mg at 07/04/21 1427   pantoprazole (PROTONIX) EC tablet 40 mg, 40 mg, Oral, Daily, [name], Whitney D, NP, 40 mg at 07/07/21 1044   PARoxetine (PAXIL) tablet 10 mg,  10 mg, Oral, Daily, [name], Whitney D, NP, 10 mg at 07/07/21 1044   polyethylene glycol (MIRALAX / GLYCOLAX) packet 17 g, 17 g, Per Tube, Daily, [name], NP, 17 g at 07/04/21 1201   potassium chloride SA (KLOR-CON) CR tablet 40 mEq, 40 mEq, Oral, Q4H, Hall, [name], DO  chlorhexidine gluconate (MEDLINE KIT)  15 mL Mouth Rinse BID   Chlorhexidine Gluconate Cloth  6 each Topical Q0600   enoxaparin (LOVENOX) injection  40 mg Subcutaneous Q24H   hydrALAZINE  25 mg Oral Q8H   hydrochlorothiazide  50 mg Oral Daily   insulin aspart  0-15 Units Subcutaneous TID WC   loratadine  10 mg Oral Daily   losartan  100 mg Oral Daily   mouth rinse  15 mL Mouth Rinse 10 times per day   pantoprazole  40 mg Oral Daily   PARoxetine  10 mg Oral Daily   polyethylene glycol  17 g Per Tube Daily   potassium chloride  40 mEq Oral Q4H    sodium chloride 250 mL (07/05/21 1155)    Physical Exam: Blood pressure (!) 167/89, pulse 70, temperature 98.2 F (36.8 C), temperature source Oral, resp. rate 16, height 5' 2"  (1.575 m), weight 73.2 kg, SpO2 97 %.    Black female older than stated age  Lungs clear S4 gallop no murmur Abdomen benign No edema Palpable peripheral pulses   Labs:   Lab Results  Component Value Date   WBC 13.2 (H) 07/05/2021   HGB 13.8 07/05/2021   HCT 42.6 07/05/2021   MCV 83.2 07/05/2021   PLT 251 07/05/2021    Recent Labs  Lab 07/04/21 0640 07/05/21 0233 07/07/21 0502  NA 141   < > 135  K 3.7   < > 2.8*  CL 106   < > 95*  CO2 24   < > 28  BUN 19   < > 14  CREATININE 1.08*   < > 0.65  CALCIUM 8.9   < > 9.5  PROT 8.2*  --   --   BILITOT 0.8  --   --   ALKPHOS 184*  --   --   ALT 191*  --   --   AST 280*  --   --   GLUCOSE 285*   < > 138*   < > = values in this interval not displayed.   No results found for: CKTOTAL, CKMB, CKMBINDEX, TROPONINI No results found for: CHOL No results found for: HDL No results found for: [facility] Lab Results  Component Value Date    TRIG 80 07/05/2021   No results found for: CHOLHDL No results found for: LDLDIRECT    Radiology: [facility] Chest Port 1 View  Result Date: 07/04/2021 CLINICAL DATA:  60 year old female intubated. EXAM: PORTABLE CHEST 1 VIEW COMPARISON:  Portable chest 11/21/2009. FINDINGS:  Portable AP supine view at 0621 hours. Endotracheal tube tip is about 14 mm above the carina in good position. Enteric tube terminates in the stomach, side hole at the gastric body. Diffuse coarse bilateral pulmonary opacity, most confluent at the left lung base. No pneumothorax or pleural effusion evident on these supine views. Mediastinal contours appear stable and within normal limits. No acute osseous abnormality identified. Negative visible bowel gas. IMPRESSION: 1. Endotracheal tube and enteric tube in good position. 2. Diffuse coarse bilateral pulmonary opacity, most confluent at the left lung base. Top differential considerations are viral/atypical bilateral pneumonia and acute pulmonary edema. Electronically Signed   By: Genevie Ann M.D.   On: 07/04/2021 06:41   ECHOCARDIOGRAM COMPLETE  Result Date: 07/05/2021    ECHOCARDIOGRAM REPORT   Patient Name:   Ellen Simmons Date of Exam: 07/05/2021 Medical Rec #:  [id number]         Height:       62.0 in Accession #:    [id number]        Weight:       150.1 lb Date of Birth:  01-03-1961        BSA:          1.692 m Patient Age:    28 years          BP:           136/74 mmHg Patient Gender: F                 HR:           80 bpm. Exam Location:  Inpatient Procedure: 2D Echo, Cardiac Doppler and Color Doppler Indications:    Dyspnea  History:        Patient has no prior history of Echocardiogram examinations.                 Risk Factors:Hypertension.  Sonographer:    Glo Herring Referring Phys: [place]  1. Left ventricular ejection fraction, by estimation, is 45 to 50%. The left ventricle has mildly decreased function. The left ventricle demonstrates global  hypokinesis. There is mild left ventricular hypertrophy. Left ventricular diastolic parameters are consistent with Grade I diastolic dysfunction (impaired relaxation).  2. Right ventricular systolic function is normal. The right ventricular size is normal. Tricuspid regurgitation signal is inadequate for assessing PA pressure.  3. Left atrial size was mild to moderately dilated.  4. The mitral valve is normal in structure. Trivial mitral valve regurgitation. No evidence of mitral stenosis.  5. The aortic valve is tricuspid. Aortic valve regurgitation is not visualized. No aortic stenosis is present.  6. The inferior vena cava is normal in size with <50% respiratory variability, suggesting right atrial pressure of 8 mmHg. FINDINGS  Left Ventricle: Left ventricular ejection fraction, by estimation, is 45 to 50%. The left ventricle has mildly decreased function. The left ventricle demonstrates global hypokinesis. The left ventricular internal cavity size was normal in size. There is  mild left ventricular hypertrophy. Left ventricular diastolic parameters are consistent with Grade I diastolic dysfunction (impaired relaxation). Right Ventricle: The right ventricular size is normal. No increase in right ventricular wall thickness. Right ventricular systolic function is normal. Tricuspid regurgitation signal is inadequate for assessing PA pressure. Left Atrium: Left atrial size was mild to moderately dilated. Right Atrium: Right atrial size was normal in size. Pericardium: There is no evidence of pericardial effusion. Mitral Valve: The mitral valve is normal in structure. Trivial mitral  valve regurgitation. No evidence of mitral valve stenosis. Tricuspid Valve: The tricuspid valve is normal in structure. Tricuspid valve regurgitation is trivial. Aortic Valve: The aortic valve is tricuspid. Aortic valve regurgitation is not visualized. No aortic stenosis is present. Aortic valve mean gradient measures 3.0 mmHg. Aortic  valve peak gradient measures 5.2 mmHg. Aortic valve area, by VTI measures 2.49 cm. Pulmonic Valve: The pulmonic valve was normal in structure. Pulmonic valve regurgitation is not visualized. Aorta: The aortic root is normal in size and structure. Venous: The inferior vena cava is normal in size with less than 50% respiratory variability, suggesting right atrial pressure of 8 mmHg. IAS/Shunts: No atrial level shunt detected by color flow Doppler.  LEFT VENTRICLE PLAX 2D LVIDd:         5.00 cm      Diastology LVIDs:         3.30 cm      LV e' medial:    6.20 cm/s LV PW:         1.30 cm      LV E/e' medial:  13.5 LV IVS:        1.30 cm      LV e' lateral:   7.07 cm/s LVOT diam:     2.20 cm      LV E/e' lateral: 11.9 LV SV:         53 LV SV Index:   31 LVOT Area:     3.80 cm  LV Volumes (MOD) LV vol d, MOD A2C: 140.0 ml LV vol d, MOD A4C: 115.0 ml LV vol s, MOD A2C: 71.9 ml LV vol s, MOD A4C: 61.0 ml LV SV MOD A2C:     68.1 ml LV SV MOD A4C:     115.0 ml LV SV MOD BP:      60.8 ml RIGHT VENTRICLE             IVC RV Basal diam:  3.80 cm     IVC diam: 2.00 cm RV S prime:     12.40 cm/s LEFT ATRIUM             Index        RIGHT ATRIUM           Index LA diam:        4.00 cm 2.36 cm/m   RA Area:     16.90 cm LA Vol (A2C):   86.1 ml 50.88 ml/m  RA Volume:   43.80 ml  25.88 ml/m LA Vol (A4C):   51.0 ml 30.14 ml/m LA Biplane Vol: 67.3 ml 39.77 ml/m  AORTIC VALVE                    PULMONIC VALVE AV Area (Vmax):    2.48 cm     PV Vmax:       1.03 m/s AV Area (Vmean):   2.39 cm     PV Peak grad:  4.2 mmHg AV Area (VTI):     2.49 cm AV Vmax:           114.00 cm/s AV Vmean:          79.100 cm/s AV VTI:            0.212 m AV Peak Grad:      5.2 mmHg AV Mean Grad:      3.0 mmHg LVOT Vmax:         74.40 cm/s LVOT Vmean:  49.800 cm/s LVOT VTI:          0.139 m LVOT/AV VTI ratio: 0.66  AORTA Ao Root diam: 3.00 cm Ao Asc diam:  2.90 cm MITRAL VALVE MV Area (PHT): 4.06 cm    SHUNTS MV Decel Time: 187 msec    Systemic  VTI:  0.14 m MV E velocity: 84.00 cm/s  Systemic Diam: 2.20 cm MV A velocity: 97.30 cm/s MV E/A ratio:  0.86 Dalton McleanMD Electronically signed by Franki Monte Signature Date/Time: 07/05/2021/5:41:12 PM    Final     EKG: ST LVH LAE    ASSESSMENT AND PLAN:   Low EF:  in setting of poorly controlled BP and crack cocaine use. No RWMA.  Primary issue is to Rx BP and stop using drugs. Continue current meds Increase hydralazine to 50 mg tid and consider changing to bidil to add nitrates if needed Avoid beta blockers with cocaine use Will need f/u echo in 3 months after better BP control and no drug use to see if EF improves No need for further inpatient cardiac w/u HTN;  see recs above    Cardiology will sign off  F/U will be made at [facility] office with echo in 3 months   Signed: Jenkins Rouge 07/07/2021, 10:59 AM

## 2021-07-07 NOTE — Progress Notes (Signed)
The patient is injury-free, afebrile, alert, and oriented X 3. BP was moderately elevated. Hydralazine PRN was administered, and the hospitalist on call was notified. The rest of her vital signs were within the baseline during this shift. She complained of mild headaches that improved with Tylenol. Pt denies chest pain, SOB, nausea, vomiting, dizziness, signs or symptoms of bleeding or infection or acute changes during this shift. We will continue to monitor and work toward achieving the care plan goals.

## 2021-07-08 ENCOUNTER — Inpatient Hospital Stay (HOSPITAL_COMMUNITY): Payer: 59

## 2021-07-08 LAB — COMPREHENSIVE METABOLIC PANEL
ALT: 65 U/L — ABNORMAL HIGH (ref 0–44)
AST: 46 U/L — ABNORMAL HIGH (ref 15–41)
Albumin: 4.1 g/dL (ref 3.5–5.0)
Alkaline Phosphatase: 141 U/L — ABNORMAL HIGH (ref 38–126)
Anion gap: 10 (ref 5–15)
BUN: 18 mg/dL (ref 6–20)
CO2: 25 mmol/L (ref 22–32)
Calcium: 9.6 mg/dL (ref 8.9–10.3)
Chloride: 99 mmol/L (ref 98–111)
Creatinine, Ser: 0.73 mg/dL (ref 0.44–1.00)
GFR, Estimated: >60 mL/min (ref >60)
Glucose, Bld: 127 mg/dL — ABNORMAL HIGH (ref 70–99)
Potassium: 4.4 mmol/L (ref 3.5–5.1)
Sodium: 134 mmol/L — ABNORMAL LOW (ref 135–145)
Total Bilirubin: 0.7 mg/dL (ref 0.3–1.2)
Total Protein: 7.8 g/dL (ref 6.5–8.1)

## 2021-07-08 LAB — CBC WITH DIFFERENTIAL/PLATELET
Abs Immature Granulocytes: 0.04 10*3/uL (ref 0.00–0.07)
Basophils Absolute: 0.1 10*3/uL (ref 0.0–0.1)
Basophils Relative: 1 %
Eosinophils Absolute: 0.2 10*3/uL (ref 0.0–0.5)
Eosinophils Relative: 2 %
HCT: 51.9 % — ABNORMAL HIGH (ref 36.0–46.0)
Hemoglobin: 17.1 g/dL — ABNORMAL HIGH (ref 12.0–15.0)
Immature Granulocytes: 0 %
Lymphocytes Relative: 40 %
Lymphs Abs: 3.6 10*3/uL (ref 0.7–4.0)
MCH: 26.9 pg (ref 26.0–34.0)
MCHC: 32.9 g/dL (ref 30.0–36.0)
MCV: 81.7 fL (ref 80.0–100.0)
Monocytes Absolute: 0.6 10*3/uL (ref 0.1–1.0)
Monocytes Relative: 6 %
Neutro Abs: 4.5 10*3/uL (ref 1.7–7.7)
Neutrophils Relative %: 51 %
Platelets: 321 10*3/uL (ref 150–400)
RBC: 6.35 MIL/uL — ABNORMAL HIGH (ref 3.87–5.11)
RDW: 15 % (ref 11.5–15.5)
WBC: 9 10*3/uL (ref 4.0–10.5)
nRBC: 0 % (ref 0.0–0.2)

## 2021-07-08 LAB — GLUCOSE, CAPILLARY
Glucose-Capillary: 128 mg/dL — ABNORMAL HIGH (ref 70–99)
Glucose-Capillary: 108 mg/dL — ABNORMAL HIGH (ref 70–99)
Glucose-Capillary: 118 mg/dL — ABNORMAL HIGH (ref 70–99)
Glucose-Capillary: 149 mg/dL — ABNORMAL HIGH (ref 70–99)
Glucose-Capillary: 140 mg/dL — ABNORMAL HIGH (ref 70–99)

## 2021-07-08 LAB — PROCALCITONIN: Procalcitonin: 0.13 ng/mL

## 2021-07-08 LAB — LACTIC ACID, PLASMA: Lactic Acid, Venous: 1.7 mmol/L (ref 0.5–1.9)

## 2021-07-08 LAB — TROPONIN I (HIGH SENSITIVITY): Troponin I (High Sensitivity): 11 ng/L (ref <18)

## 2021-07-08 LAB — TSH: TSH: 1.35 u[IU]/mL (ref 0.350–4.500)

## 2021-07-08 MED ORDER — LORAZEPAM 2 MG/ML IJ SOLN: 1.0000 mg | Freq: Once | INTRAMUSCULAR | Status: DC

## 2021-07-08 MED ORDER — SODIUM CHLORIDE 0.9 % IV SOLN: INTRAVENOUS | Status: DC

## 2021-07-08 MED ORDER — LORAZEPAM 2 MG/ML IJ SOLN
0.5000 mg | INTRAMUSCULAR | Status: DC | PRN
  Administered 2021-07-08: 0.5 mg via INTRAVENOUS
  Filled 2021-07-08: qty 1

## 2021-07-08 MED ORDER — ISOSORB DINITRATE-HYDRALAZINE 20-37.5 MG PO TABS
1.0000 | ORAL_TABLET | Freq: Three times a day (TID) | ORAL | Status: DC
  Administered 2021-07-08: 1 via ORAL
  Filled 2021-07-08 (×2): qty 1

## 2021-07-08 MED ORDER — ISOSORB DINITRATE-HYDRALAZINE 20-37.5 MG PO TABS: 1.0000 | ORAL_TABLET | Freq: Three times a day (TID) | ORAL | Status: DC

## 2021-07-08 MED ORDER — SODIUM CHLORIDE 0.9 % IV BOLUS
1000.0000 mL | INTRAVENOUS | Status: AC
  Administered 2021-07-08: 1000 mL via INTRAVENOUS

## 2021-07-08 NOTE — Progress Notes (Addendum)
Called by bedside RN due to acute hypotension with BP 62/50.  1 L IV fluid bolus normal saline ordered to be administered stat.  Presented at bedside, patient appears weak, not feeling well.  She is alert and responding to questions appropriately.  Denies any chest pain.  Earlier this afternoon had a headache with negative CT head, dizziness, and emesis earlier this evening.  Suspect she is withdrawing from cocaine.  As needed IV Ativan ordered for withdrawal symptoms and anxiety.    We will continue maintenance IV fluid overnight normal saline at 50 cc/h x 1 day.    Continue to closely monitor vital signs.  Maintain MAP greater than 65.  Patient is transferred to higher level of care, progressive care unit, for closer monitoring.  Patient's daughter has been updated and would like to visit her mother tonight.

## 2021-07-08 NOTE — Progress Notes (Signed)
   07/08/21 1850  Assess: MEWS Score  Temp 98 F (36.7 C)  BP (!) 62/50  Pulse Rate 90  Resp 18  Level of Consciousness Alert  SpO2 95 %  O2 Device Room Air  Assess: MEWS Score  MEWS Temp 0  MEWS Systolic 3  MEWS Pulse 0  MEWS RR 0  MEWS LOC 0  MEWS Score 3  MEWS Score Color Yellow  Assess: if the MEWS score is Yellow or Red  Were vital signs taken at a resting state? Yes  Focused Assessment Change from prior assessment (see assessment flowsheet)  Does the patient meet 2 or more of the SIRS criteria? No  MEWS guidelines implemented *See Row Information* Yes  Treat  MEWS Interventions Administered scheduled meds/treatments;Administered prn meds/treatments  Pain Scale 0-10  Pain Score 0  Take Vital Signs  Increase Vital Sign Frequency  Yellow: Q 2hr X 2 then Q 4hr X 2, if remains yellow, continue Q 4hrs  Escalate  MEWS: Escalate Yellow: discuss with charge nurse/RN and consider discussing with provider and RRT  Notify: Charge Nurse/RN  Name of Charge Nurse/RN Notified Wendy, RN  Date Charge Nurse/RN Notified 07/08/21  Time Charge Nurse/RN Notified 1850  Notify: Provider  Provider Name/Title Margo Aye, MD  Date Provider Notified 07/08/21  Time Provider Notified 1850  Notification Type Page  Notification Reason Change in status  Provider response At bedside;See new orders  Date of Provider Response 07/08/21  Time of Provider Response 1850  Notify: Rapid Response  Name of Rapid Response RN Notified Sarah, RN  Date Rapid Response Notified 07/08/21  Time Rapid Response Notified 1850  Document  Patient Outcome Transferred/level of care increased (pt being transferred to progressive care unit)  Progress note created (see row info) Yes  Assess: SIRS CRITERIA  SIRS Temperature  0  SIRS Pulse 0  SIRS Respirations  0  SIRS WBC 0  SIRS Score Sum  0   Pt in Yellow MEWS d/t low BP of 62/50 taken manually. RN called to room by NT. Pt was in bathroom vomiting. RN administered prn  zofran. RN assessed pt once she was in bed and pt had tremors, cold sweats, headache, and pt reported "seeing colors." RN called charge RN to room to take a manual BP. Rapid response called to bedside. MD notified. New orders placed for NS bolus and MD gave RN verbal order at bedside for 0.5 mg Ativan IV q4h prn. Order placed for pt to transfer to progressive care unit. RN administered prn ativan. Yellow MEWS guidelines implemented.

## 2021-07-08 NOTE — Progress Notes (Signed)
BP 188/101, scheduled hydralazine given. We will reassess in 60 mins before giving PRN hydralazine.

## 2021-07-08 NOTE — Progress Notes (Signed)
Pt's BP improved after 1000 ml NS bolus. Report was given to Barbie Haggis, RN. We will transfer pt shortly

## 2021-07-08 NOTE — Progress Notes (Signed)
PROGRESS NOTE  Ellen Simmons ZCH:[id number] DOB: October 15, 1960 DOA: 07/04/2021 PCP: [name], NP  HPI/Recap of past 44 hours: 60 year old woman with PMH HTN, cocaine abuse, who presented with worsening shortness of breath.  Upon presentation to the ED she was in hypertensive emergency with associated flash pulmonary edema.  UDS +cocaine +THC.  She was subsequently intubated in the emergency room.  Extubated on 07/05/21 to 2L [place].  BP improved after restarting home oral antihypertensives.  TRH assumed care on 07/06/21.  On HCTZ, losartan, and hydralazine for blood pressure control.  She was seen by cardiology, provided recommendations, signed off.  Recommended follow-up outpatient in 3 months we will repeat a 2D echo.  Patient has been advised and counseled on the importance of polysubstance abuse cessation, she was receptive.  07/08/21: Patient was seen and examined at bedside.  Ellen Simmons was present in the room.  She states she does not feel well.  Reports a headache and dizziness.  Lab work unrevealing.  Will obtain orthostatic vital signs.   Assessment/Plan: Active Problems:   Acute respiratory failure with hypoxia (HCC)   Hypertensive emergency   Substance abuse ([place])   AKI (acute kidney injury) ([place])  Acute hypoxic hypercarbic respiratory failure secondary to flash pulmonary edema in the setting of hypertensive emergency. Not on oxygen supplementation at baseline. Presented with dyspnea, hypoxia, with severely elevated blood pressures and flash pulmonary edema on chest x-ray. Subsequently intubated in the ED and transferred to the ICU. She was extubated on 07/05/21 to 2 L nasal cannula.  Hypertensive emergency with flash pulmonary edema P.o. antihypertensives have been titrated.   Increased home dose of hydrochlorothiazide to 50 mg daily On losartan 100 mg daily Bidil started per cardiologist recommendation 1 tablet 3 times daily Goal blood pressure normotensive. IV  antihypertensive as needed with parameters. Continue to closely monitor vital signs.  Dizziness/headache Tylenol for headaches Obtain orthostatic vital signs Control BP Fall precautions. PT OT to assess  Acute systolic CHF in the setting of cocaine abuse 2D echo done on 07/05/21 LVEF 45-50%, LV global hypokinesis Seen by cardiology, recommended repeat 2D echo in 3 months, to avoid cocaine. Continue strict I&O and daily weight (urine output not documented)  Type 2 diabetes with hyperglycemia Hemoglobin A1c 6.3 on 07/05/2021 Continue insulin sliding scale.  Resolved AKI Baseline creatinine appears to be 0.6 with a GFR of greater than 60 Creatinine back to baseline from 1.18 with GFR 54. Continue to avoid nephrotoxic agents. Monitor urine output with strict I's and O's  Polysubstance abuse including cocaine and THC UDS positive for cocaine on UDS on 07/04/2021 Polysubstance cessation counseling done at bedside. She is receptive to cessation. TOC consulted to assist with providing resources.  Resolved toxic encephalopathy in the setting of cocaine THC abuse Mentation is at baseline   Code Status: Full code  Family Communication: Fianc at bedside.  Disposition Plan: Likely will discharge to home once blood pressure is controlled, likely on 07/09/2021.   Consultants: PCCM Cardiology, signed off.  Procedures: Intubation Extubation  Antimicrobials: None  DVT prophylaxis: Subcu Lovenox daily  Status is: Inpatient  Inpatient.  Patient will require at least 2 midnights for further evaluation and treatment of present condition.      Objective: Vitals:   07/08/21 0657 07/08/21 0834 07/08/21 1343 07/08/21 1501  BP: (!) 188/103 (!) 151/98 (!) 190/97 (!) 167/107  Pulse:  92 90 (!) 110  Resp:  19 18   Temp:  98.2 F (36.8 C) 98.7 F (  37.1 C)   TempSrc:  Oral Oral   SpO2:  98% 98%   Weight:      Height:        Intake/Output Summary (Last 24 hours) at  07/08/2021 1549 Last data filed at 07/07/2021 1700 Gross per 24 hour  Intake 240 ml  Output --  Net 240 ml   Filed Weights   07/05/21 0500 07/07/21 0500 07/08/21 0500  Weight: 68.1 kg 73.2 kg 71.6 kg    Exam:  General: 60 y.o. year-old female well-developed well-nourished in no acute distress.  She is alert oriented x3.   Cardiovascular: Regular rate and rhythm no rubs or gallops. Respiratory: Clear to auscultation with no wheezes or rales. Abdomen: Soft nontender normal bowel sounds present. musculoskeletal: No lower extremity edema bilaterally. Skin: No ulcerative lesions noted. Psychiatry: Mood is appropriate for condition and setting.   Data Reviewed: CBC: Recent Labs  Lab 07/04/21 0640 07/05/21 0233 07/08/21 1055  WBC 17.4* 13.2* 9.0  NEUTROABS 10.1*  --  4.5  HGB 16.2* 13.8 17.1*  HCT 50.8* 42.6 51.9*  MCV 84.4 83.2 81.7  PLT 268 251 833   Basic Metabolic Panel: Recent Labs  Lab 07/04/21 0640 07/04/21 1656 07/05/21 0233 07/05/21 1556 07/07/21 0502 07/08/21 0523  NA 141  --  141  --  135 134*  K 3.7  --  3.8  --  2.8* 4.4  CL 106  --  108  --  95* 99  CO2 24  --  24  --  28 25  GLUCOSE 285*  --  175*  --  138* 127*  BUN 19  --  29*  --  14 18  CREATININE 1.08*  --  1.18*  --  0.65 0.73  CALCIUM 8.9  --  8.8*  --  9.5 9.6  MG 2.4  --  2.0  --  1.7  --   PHOS 5.5* 4.7* 3.8 3.4  --   --    GFR: Estimated Creatinine Clearance: 71.9 mL/min (by C-G formula based on SCr of 0.73 mg/dL). Liver Function Tests: Recent Labs  Lab 07/04/21 0640 07/08/21 0523  AST 280* 46*  ALT 191* 65*  ALKPHOS 184* 141*  BILITOT 0.8 0.7  PROT 8.2* 7.8  ALBUMIN 4.2 4.1   No results for input(s): LIPASE, AMYLASE in the last 168 hours. No results for input(s): AMMONIA in the last 168 hours. Coagulation Profile: No results for input(s): INR, PROTIME in the last 168 hours. Cardiac Enzymes: No results for input(s): CKTOTAL, CKMB, CKMBINDEX, TROPONINI in the last 168  hours. BNP (last 3 results) No results for input(s): PROBNP in the last 8760 hours. HbA1C: No results for input(s): HGBA1C in the last 72 hours.  CBG: Recent Labs  Lab 07/07/21 1132 07/07/21 1656 07/07/21 2131 07/08/21 0726 07/08/21 1133  GLUCAP 136* 111* 137* 128* 108*   Lipid Profile: No results for input(s): CHOL, HDL, LDLCALC, TRIG, CHOLHDL, LDLDIRECT in the last 72 hours.  Thyroid Function Tests: Recent Labs    07/08/21 1055  TSH 1.350   Anemia Panel: No results for input(s): VITAMINB12, FOLATE, FERRITIN, TIBC, IRON, RETICCTPCT in the last 72 hours. Urine analysis:    Component Value Date/Time   COLORURINE STRAW (A) 07/04/2021 0750   APPEARANCEUR HAZY (A) 07/04/2021 0750   LABSPEC 1.006 07/04/2021 0750   PHURINE 6.0 07/04/2021 0750   GLUCOSEU >=500 (A) 07/04/2021 0750   HGBUR SMALL (A) 07/04/2021 0750   BILIRUBINUR NEGATIVE 07/04/2021 0750   KETONESUR NEGATIVE  07/04/2021 0750   PROTEINUR 100 (A) 07/04/2021 0750   NITRITE NEGATIVE 07/04/2021 0750   LEUKOCYTESUR NEGATIVE 07/04/2021 0750   Sepsis Labs: _0 (procalcitonin:4,lacticidven:4)  ) Recent Results (from the past 240 hour(s))  Resp Panel by RT-PCR (Flu A&B, Covid) Nasopharyngeal Swab     Status: None   Collection Time: 07/04/21  6:40 AM   Specimen: Nasopharyngeal Swab; Nasopharyngeal(NP) swabs in vial transport medium  Result Value Ref Range Status   SARS Coronavirus 2 by RT PCR NEGATIVE NEGATIVE Final    Comment: (NOTE) SARS-CoV-2 target nucleic acids are NOT DETECTED.  The SARS-CoV-2 RNA is generally detectable in upper respiratory specimens during the acute phase of infection. The lowest concentration of SARS-CoV-2 viral copies this assay can detect is 138 copies/mL. A negative result does not preclude SARS-Cov-2 infection and should not be used as the sole basis for treatment or other patient management decisions. A negative result may occur with  improper specimen collection/handling,  submission of specimen other than nasopharyngeal swab, presence of viral mutation(s) within the areas targeted by this assay, and inadequate number of viral copies(<138 copies/mL). A negative result must be combined with clinical observations, patient history, and epidemiological information. The expected result is Negative.  Fact Sheet for Patients:  EntrepreneurPulse.com.au  Fact Sheet for Healthcare Providers:  IncredibleEmployment.be  This test is no t yet approved or cleared by the [place] FDA and  has been authorized for detection and/or diagnosis of SARS-CoV-2 by FDA under an Emergency Use Authorization (EUA). This EUA will remain  in effect (meaning this test can be used) for the duration of the COVID-19 declaration under Section 564(b)(1) of the Act, 21 U.S.C.section 360bbb-3(b)(1), unless the authorization is terminated  or revoked sooner.       Influenza A by PCR NEGATIVE NEGATIVE Final   Influenza B by PCR NEGATIVE NEGATIVE Final    Comment: (NOTE) The Xpert Xpress SARS-CoV-2/FLU/RSV plus assay is intended as an aid in the diagnosis of influenza from Nasopharyngeal swab specimens and should not be used as a sole basis for treatment. Nasal washings and aspirates are unacceptable for Xpert Xpress SARS-CoV-2/FLU/RSV testing.  Fact Sheet for Patients: EntrepreneurPulse.com.au  Fact Sheet for Healthcare Providers: IncredibleEmployment.be  This test is not yet approved or cleared by the [place] FDA and has been authorized for detection and/or diagnosis of SARS-CoV-2 by FDA under an Emergency Use Authorization (EUA). This EUA will remain in effect (meaning this test can be used) for the duration of the COVID-19 declaration under Section 564(b)(1) of the Act, 21 U.S.C. section 360bbb-3(b)(1), unless the authorization is terminated or revoked.  Performed at [facility], [place] [address] [place], [place] [address]   MRSA Next Gen by PCR, Nasal     Status: None   Collection Time: 07/04/21  8:53 AM   Specimen: Nasal Mucosa; Nasal Swab  Result Value Ref Range Status   MRSA by PCR Next Gen NOT DETECTED NOT DETECTED Final    Comment: (NOTE) The GeneXpert MRSA Assay (FDA approved for NASAL specimens only), is one component of a comprehensive MRSA colonization surveillance program. It is not intended to diagnose MRSA infection nor to guide or monitor treatment for MRSA infections. Test performance is not FDA approved in patients less than 44 years old. Performed at [facility], [place] [address] [place], [place] [address]       Studies: No results found.  Scheduled Meds:  chlorhexidine gluconate (MEDLINE KIT)  15 mL Mouth Rinse BID  Chlorhexidine Gluconate Cloth  6 each Topical Q0600   enoxaparin (LOVENOX) injection  40 mg Subcutaneous Q24H   hydrochlorothiazide  50 mg Oral Daily   insulin aspart  0-15 Units Subcutaneous TID WC   isosorbide-hydrALAZINE  1 tablet Oral TID   loratadine  10 mg Oral Daily   losartan  100 mg Oral Daily   mouth rinse  15 mL Mouth Rinse 10 times per day   pantoprazole  40 mg Oral Daily   PARoxetine  10 mg Oral Daily   polyethylene glycol  17 g Per Tube Daily    Continuous Infusions:  sodium chloride 250 mL (07/05/21 1155)     LOS: 4 days     [name], MD [facility] Pager [phone]  If 7PM-7AM, please contact night-coverage [email or web address] Password [facility] 07/08/2021, 3:49 PM

## 2021-07-08 NOTE — Plan of Care (Signed)

## 2021-07-08 NOTE — Progress Notes (Signed)
The patient is injury-free, afebrile, alert, and oriented X 3. BP was moderately elevated. Hydralazine PRN was administered. The rest of her vital signs were within the baseline during this shift. She complained of mild headaches that improved with Tylenol. Pt denies chest pain, SOB, nausea, vomiting, dizziness, signs or symptoms of bleeding or infection or acute changes during this shift. We will continue to monitor and work toward achieving the care plan goals

## 2021-07-09 ENCOUNTER — Encounter (HOSPITAL_COMMUNITY): Payer: Self-pay | Admitting: Pulmonary Disease

## 2021-07-09 DIAGNOSIS — F1910 Other psychoactive substance abuse, uncomplicated: Secondary | ICD-10-CM

## 2021-07-09 LAB — HEPATITIS PANEL, ACUTE
HCV Ab: NONREACTIVE
Hep A IgM: NONREACTIVE
Hep B C IgM: NONREACTIVE
Hepatitis B Surface Ag: NONREACTIVE

## 2021-07-09 LAB — CBC
HCT: 45.9 % (ref 36.0–46.0)
Hemoglobin: 15 g/dL (ref 12.0–15.0)
MCH: 27 pg (ref 26.0–34.0)
MCHC: 32.7 g/dL (ref 30.0–36.0)
MCV: 82.7 fL (ref 80.0–100.0)
Platelets: 324 10*3/uL (ref 150–400)
RBC: 5.55 MIL/uL — ABNORMAL HIGH (ref 3.87–5.11)
RDW: 14.8 % (ref 11.5–15.5)
WBC: 9.3 10*3/uL (ref 4.0–10.5)
nRBC: 0 % (ref 0.0–0.2)

## 2021-07-09 LAB — COMPREHENSIVE METABOLIC PANEL
ALT: 59 U/L — ABNORMAL HIGH (ref 0–44)
AST: 51 U/L — ABNORMAL HIGH (ref 15–41)
Albumin: 3.9 g/dL (ref 3.5–5.0)
Alkaline Phosphatase: 133 U/L — ABNORMAL HIGH (ref 38–126)
Anion gap: 8 (ref 5–15)
BUN: 22 mg/dL — ABNORMAL HIGH (ref 6–20)
CO2: 25 mmol/L (ref 22–32)
Calcium: 9.4 mg/dL (ref 8.9–10.3)
Chloride: 101 mmol/L (ref 98–111)
Creatinine, Ser: 0.83 mg/dL (ref 0.44–1.00)
GFR, Estimated: >60 mL/min (ref >60)
Glucose, Bld: 131 mg/dL — ABNORMAL HIGH (ref 70–99)
Potassium: 3.9 mmol/L (ref 3.5–5.1)
Sodium: 134 mmol/L — ABNORMAL LOW (ref 135–145)
Total Bilirubin: 0.5 mg/dL (ref 0.3–1.2)
Total Protein: 7.7 g/dL (ref 6.5–8.1)

## 2021-07-09 LAB — PHOSPHORUS: Phosphorus: 4.2 mg/dL (ref 2.5–4.6)

## 2021-07-09 LAB — RAPID URINE DRUG SCREEN, HOSP PERFORMED
Amphetamines: NOT DETECTED
Barbiturates: NOT DETECTED
Benzodiazepines: NOT DETECTED
Cocaine: NOT DETECTED
Opiates: NOT DETECTED
Tetrahydrocannabinol: POSITIVE — AB

## 2021-07-09 LAB — GLUCOSE, CAPILLARY
Glucose-Capillary: 135 mg/dL — ABNORMAL HIGH (ref 70–99)
Glucose-Capillary: 93 mg/dL (ref 70–99)
Glucose-Capillary: 120 mg/dL — ABNORMAL HIGH (ref 70–99)
Glucose-Capillary: 116 mg/dL — ABNORMAL HIGH (ref 70–99)

## 2021-07-09 LAB — MAGNESIUM: Magnesium: 2 mg/dL (ref 1.7–2.4)

## 2021-07-09 MED ORDER — HYDROXYZINE HCL 25 MG PO TABS: 25.0000 mg | ORAL_TABLET | Freq: Three times a day (TID) | ORAL | Status: DC | PRN

## 2021-07-09 NOTE — Evaluation (Addendum)
Physical Therapy Evaluation Patient Details Name: FARHA DANO MRN: [id number] DOB: [birth year] Today's Date: 07/09/2021  History of Present Illness  60 year old woman seen in  ED d/t  hypertensive emergency with associated acute hypercarbic respiratory failure and  flash pulmonary edema.  UDS +cocaine +THC.  She was subsequently intubated in the emergency room.  Extubated on 07/05/21 to 2L [place]. ardiology consulted adn signed off.  PMH: HTN, cocaine abuse, who presented with worsening shortness of breath.    Clinical Impression  Patient evaluated by Physical Therapy with no further acute PT needs identified. All education has been completed and the patient has no further questions.   See below for any follow-up Physical Therapy or equipment needs. PT is signing off. Thank you for this referral.  Pt mobilizing well with PT today, overall independent with mobility, encouraged pt to be up in chair and continue mobilizing. HR 78 to 90 max while ambulating.    Recommendations for follow up therapy are one component of a multi-disciplinary discharge planning process, led by the attending physician.  Recommendations may be updated based on patient status, additional functional criteria and insurance authorization.  Follow Up Recommendations No PT follow up    Assistance Recommended at Discharge None  Functional Status Assessment Patient has had a recent decline in their functional status and demonstrates the ability to make significant improvements in function in a reasonable and predictable amount of time.  Equipment Recommendations  None recommended by PT    Recommendations for Other Services       Precautions / Restrictions Precautions Precautions: None Restrictions Weight Bearing Restrictions: No      Mobility  Bed Mobility Overal bed mobility: Independent                  Transfers Overall transfer level: Independent Equipment used: None                General transfer comment: from bed, BSC and to recliner. no assist    Ambulation/Gait Ambulation/Gait assistance: Supervision Gait Distance (Feet): 140 Feet Assistive device: None Gait Pattern/deviations: Step-through pattern       General Gait Details: supervision for safety, slihgt hesitation with gait swing through, without overt LOB  Stairs            Wheelchair Mobility    Modified Rankin (Stroke Patients Only)       Balance     Sitting balance-Leahy Scale: Good       Standing balance-Leahy Scale: Good               High level balance activites: Side stepping;Backward walking;Turns;Head turns High Level Balance Comments: no LOB with above             Pertinent Vitals/Pain Pain Assessment: No/denies pain    Home Living Family/patient expects to be discharged to:: Private residence Living Arrangements: Alone Available Help at Discharge: Family  Works at [employer]: None Additional Comments: pt unsure if she will go to her dtr's or to "halfway house" at d/c.    Prior Function Prior Level of Function : Independent/Modified Independent                     Hand Dominance        Extremity/Trunk Assessment        Lower Extremity Assessment Lower Extremity Assessment: Overall WFL for tasks assessed       Communication  Communication: No difficulties  Cognition Arousal/Alertness: Awake/alert Behavior During Therapy: WFL for tasks assessed/performed Overall Cognitive Status: Within Functional Limits for tasks assessed                                          General Comments      Exercises     Assessment/Plan    PT Assessment Patient does not need any further PT services  PT Problem List         PT Treatment Interventions      PT Goals (Current goals can be found in the Care Plan section)  Acute Rehab PT Goals Patient Stated Goal: home soon, to dtr's for  thanksgiving PT Goal Formulation: All assessment and education complete, DC therapy    Frequency     Barriers to discharge        Co-evaluation               AM-PAC PT "6 Clicks" Mobility  Outcome Measure Help needed turning from your back to your side while in a flat bed without using bedrails?: None Help needed moving from lying on your back to sitting on the side of a flat bed without using bedrails?: None Help needed moving to and from a bed to a chair (including a wheelchair)?: None Help needed standing up from a chair using your arms (e.g., wheelchair or bedside chair)?: None Help needed to walk in hospital room?: None Help needed climbing 3-5 steps with a railing? : None 6 Click Score: 24    End of Session Equipment Utilized During Treatment: Gait belt Activity Tolerance: Patient tolerated treatment well Patient left: with call bell/phone within reach;in chair (alarm pad  in chair, no box in room, RN aware) Nurse Communication: Mobility status PT Visit Diagnosis: Other abnormalities of gait and mobility (R26.89)    Time: 1000-1021 PT Time Calculation (min) (ACUTE ONLY): 21 min   Charges:   PT Evaluation $PT Eval Low Complexity: 1 Low          Zennie Ayars, PT  Acute Rehab Dept ([facility]) [phone] Pager [phone]  07/09/2021   [facility] 07/09/2021, 10:26 AM

## 2021-07-09 NOTE — Plan of Care (Signed)
  Problem: Clinical Measurements: Goal: Respiratory complications will improve Outcome: Progressing Goal: Cardiovascular complication will be avoided Outcome: Progressing   Problem: Coping: Goal: Level of anxiety will decrease Outcome: Progressing   

## 2021-07-09 NOTE — Progress Notes (Signed)
OT Cancellation Note  Patient Details Name: Ellen Simmons MRN: [id number] DOB: 1961/05/17   Cancelled Treatment:    Reason Eval/Treat Not Completed: OT screened, no needs identified, will sign off. Per PT patient independent.  Shanley Furlough L Nikolaus Pienta 07/09/2021, 12:58 PM
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
## 2021-07-09 NOTE — Progress Notes (Signed)
PROGRESS NOTE  Ellen Simmons WSF:[id number] DOB: 08/14/1961 DOA: 07/04/2021 PCP: [name], NP  HPI/Recap of past 56 hours: 60 year old woman with PMH HTN, cocaine abuse, who presented with worsening shortness of breath and respiratory distress.  Upon presentation to the ED she was in hypertensive emergency with associated flash pulmonary edema.  UDS +cocaine +THC.  She was subsequently intubated in the emergency room and admitted to the ICU.  Extubated on 07/05/21 to 2L [place].  BP improved after restarting home oral antihypertensives.  TRH assumed care on 07/06/21.  On HCTZ, losartan, and hydralazine for blood pressure control.  She was seen by cardiology, provided recommendations, signed off.  Recommended follow-up outpatient in 3 months we will repeat a 2D echo.  Patient has been advised and counseled on the importance of polysubstance abuse cessation, she was receptive.  Hospital course complicated by acute drop in blood pressure on 07/08/2021.  Associated with restlessness, headache, generalized weakness, fatigue and positive orthostatic hypotension.  Rapid response was called, patient received 1 L of IV fluid bolus for blood pressure of 62/50.  She continued with maintenance IV fluid overnight which was discontinued on 07/09/2021.   07/09/21: Patient was seen and examined at her bedside.  She feels better today.  Blood pressure is better controlled.  Assessment/Plan: Active Problems:   Acute respiratory failure with hypoxia (HCC)   Hypertensive emergency   Substance abuse ([place])   AKI (acute kidney injury) ([place])  Resolved acute hypoxic hypercarbic respiratory failure secondary to flash pulmonary edema in the setting of hypertensive emergency. Not on oxygen supplementation at baseline. Presented with dyspnea, hypoxia, with severely elevated blood pressures and flash pulmonary edema on chest x-ray. Subsequently intubated in the ED and transferred to the ICU. She was extubated  on 07/05/21 to 2 L nasal cannula. Currently on room air with O2 saturation 99%.  Resolved hypertensive emergency with flash pulmonary edema Bidil held due to hypotension. She is currently on hydrochlorothiazide to 50 mg daily and on losartan 100 mg daily. Continue to closely monitor vital signs. IV antihypertensive as needed with parameters.  Improved, dizziness/headache Tylenol for headaches She had positive orthostatic vital signs on 07/08/2021, received IV fluid hydration. Continue fall precautions. She was assessed by PT OT with no further recommendations.  Acute systolic CHF in the setting of cocaine abuse 2D echo done on 07/05/21 LVEF 45-50%, LV global hypokinesis Seen by cardiology, recommended repeat 2D echo in 3 months, to avoid cocaine. Continue strict I&O and daily weight (urine output not documented)  Cocaine withdrawal + Restlessness, headaches, generalized fatigue UDS on 07/04/2021 positive for cocaine. Repeated UDS on 07/09/2021, negative for cocaine, positive for THC. Seen by psychiatry, recommendation for hydroxyzine 25 mg 3 times daily as needed for anxiety.  QTC prolongation Presenting QTC on admission 606 on 07/04/2021. Repeated QTC 536 on 07/09/2021. Avoid QTC prolonging agents Continue to optimize potassium and magnesium levels.  Type 2 diabetes with hyperglycemia Hemoglobin A1c 6.3 on 07/05/2021 Continue insulin sliding scale.  Resolved AKI Baseline creatinine appears to be 0.6 with a GFR of greater than 60 Creatinine back to baseline from 1.18 with GFR 54. Continue to avoid nephrotoxic agents. Monitor urine output with strict I's and O's  Polysubstance abuse including cocaine and THC UDS positive for cocaine on UDS on 07/04/2021 Repeated UDS on 07/09/2021 positive for THC. Polysubstance cessation counseling done at bedside. She is receptive to cessation. TOC consulted to assist with providing resources.  Resolved toxic encephalopathy in the  setting of  cocaine THC abuse Mentation is at baseline   Code Status: Full code  Family Communication: None at bedside.  Disposition Plan: Likely will discharge to home once blood pressure is controlled, likely on 07/10/2021.   Consultants: PCCM Cardiology, signed off.  Procedures: Intubation Extubation  Antimicrobials: None  DVT prophylaxis: Subcu Lovenox daily  Status is: Inpatient  Inpatient.  Patient will require at least 2 midnights for further evaluation and treatment of present condition.      Objective: Vitals:   07/09/21 0448 07/09/21 0500 07/09/21 0912 07/09/21 1456  BP: 132/71  133/64 (!) 147/81  Pulse: 82  79 87  Resp: 20  18   Temp: 98.4 F (36.9 C)  98.2 F (36.8 C)   TempSrc: Oral  Oral   SpO2: 96%  99%   Weight:  72.1 kg    Height:        Intake/Output Summary (Last 24 hours) at 07/09/2021 1645 Last data filed at 07/09/2021 1448 Gross per 24 hour  Intake 675.77 ml  Output --  Net 675.77 ml   Filed Weights   07/07/21 0500 07/08/21 0500 07/09/21 0500  Weight: 73.2 kg 71.6 kg 72.1 kg    Exam:  General: 60 y.o. year-old female well-developed well-nourished in no acute distress.  She is alert and oriented x3.  Cardiovascular: Regular rate and rhythm no rubs or gallops.  Respiratory: Clear to auscultation with no wheezes or rales. Abdomen: Soft nontender normal bowel sounds present.  Musculoskeletal: No lower extremity edema bilaterally.   Skin: No ulcerative lesions noted.   Psychiatry: Mood is appropriate for condition and setting.  Data Reviewed: CBC: Recent Labs  Lab 07/04/21 0640 07/05/21 0233 07/08/21 1055 07/09/21 1152  WBC 17.4* 13.2* 9.0 9.3  NEUTROABS 10.1*  --  4.5  --   HGB 16.2* 13.8 17.1* 15.0  HCT 50.8* 42.6 51.9* 45.9  MCV 84.4 83.2 81.7 82.7  PLT 268 251 321 858   Basic Metabolic Panel: Recent Labs  Lab 07/04/21 0640 07/04/21 1656 07/05/21 0233 07/05/21 1556 07/07/21 0502 07/08/21 0523  07/09/21 1152  NA 141  --  141  --  135 134* 134*  K 3.7  --  3.8  --  2.8* 4.4 3.9  CL 106  --  108  --  95* 99 101  CO2 24  --  24  --  28 25 25   GLUCOSE 285*  --  175*  --  138* 127* 131*  BUN 19  --  29*  --  14 18 22*  CREATININE 1.08*  --  1.18*  --  0.65 0.73 0.83  CALCIUM 8.9  --  8.8*  --  9.5 9.6 9.4  MG 2.4  --  2.0  --  1.7  --  2.0  PHOS 5.5* 4.7* 3.8 3.4  --   --  4.2   GFR: Estimated Creatinine Clearance: 69.5 mL/min (by C-G formula based on SCr of 0.83 mg/dL). Liver Function Tests: Recent Labs  Lab 07/04/21 0640 07/08/21 0523 07/09/21 1152  AST 280* 46* 51*  ALT 191* 65* 59*  ALKPHOS 184* 141* 133*  BILITOT 0.8 0.7 0.5  PROT 8.2* 7.8 7.7  ALBUMIN 4.2 4.1 3.9   No results for input(s): LIPASE, AMYLASE in the last 168 hours. No results for input(s): AMMONIA in the last 168 hours. Coagulation Profile: No results for input(s): INR, PROTIME in the last 168 hours. Cardiac Enzymes: No results for input(s): CKTOTAL, CKMB, CKMBINDEX, TROPONINI in the last 168 hours. BNP (  last 3 results) No results for input(s): PROBNP in the last 8760 hours. HbA1C: No results for input(s): HGBA1C in the last 72 hours.  CBG: Recent Labs  Lab 07/08/21 1649 07/08/21 2048 07/08/21 2157 07/09/21 0747 07/09/21 1140  GLUCAP 118* 149* 140* 135* 93   Lipid Profile: No results for input(s): CHOL, HDL, LDLCALC, TRIG, CHOLHDL, LDLDIRECT in the last 72 hours.  Thyroid Function Tests: Recent Labs    07/08/21 1055  TSH 1.350   Anemia Panel: No results for input(s): VITAMINB12, FOLATE, FERRITIN, TIBC, IRON, RETICCTPCT in the last 72 hours. Urine analysis:    Component Value Date/Time   COLORURINE STRAW (A) 07/04/2021 0750   APPEARANCEUR HAZY (A) 07/04/2021 0750   LABSPEC 1.006 07/04/2021 0750   PHURINE 6.0 07/04/2021 0750   GLUCOSEU >=500 (A) 07/04/2021 0750   HGBUR SMALL (A) 07/04/2021 0750   BILIRUBINUR NEGATIVE 07/04/2021 0750   KETONESUR NEGATIVE 07/04/2021 0750    PROTEINUR 100 (A) 07/04/2021 0750   NITRITE NEGATIVE 07/04/2021 0750   LEUKOCYTESUR NEGATIVE 07/04/2021 0750   Sepsis Labs: @LABRCNTIP (procalcitonin:4,lacticidven:4)  ) Recent Results (from the past 240 hour(s))  Resp Panel by RT-PCR (Flu A&B, Covid) Nasopharyngeal Swab     Status: None   Collection Time: 07/04/21  6:40 AM   Specimen: Nasopharyngeal Swab; Nasopharyngeal(NP) swabs in vial transport medium  Result Value Ref Range Status   SARS Coronavirus 2 by RT PCR NEGATIVE NEGATIVE Final    Comment: (NOTE) SARS-CoV-2 target nucleic acids are NOT DETECTED.  The SARS-CoV-2 RNA is generally detectable in upper respiratory specimens during the acute phase of infection. The lowest concentration of SARS-CoV-2 viral copies this assay can detect is 138 copies/mL. A negative result does not preclude SARS-Cov-2 infection and should not be used as the sole basis for treatment or other patient management decisions. A negative result may occur with  improper specimen collection/handling, submission of specimen other than nasopharyngeal swab, presence of viral mutation(s) within the areas targeted by this assay, and inadequate number of viral copies(<138 copies/mL). A negative result must be combined with clinical observations, patient history, and epidemiological information. The expected result is Negative.  Fact Sheet for Patients:  EntrepreneurPulse.com.au  Fact Sheet for Healthcare Providers:  IncredibleEmployment.be  This test is no t yet approved or cleared by the [place] FDA and  has been authorized for detection and/or diagnosis of SARS-CoV-2 by FDA under an Emergency Use Authorization (EUA). This EUA will remain  in effect (meaning this test can be used) for the duration of the COVID-19 declaration under Section 564(b)(1) of the Act, 21 U.S.C.section 360bbb-3(b)(1), unless the authorization is terminated  or revoked sooner.        Influenza A by PCR NEGATIVE NEGATIVE Final   Influenza B by PCR NEGATIVE NEGATIVE Final    Comment: (NOTE) The Xpert Xpress SARS-CoV-2/FLU/RSV plus assay is intended as an aid in the diagnosis of influenza from Nasopharyngeal swab specimens and should not be used as a sole basis for treatment. Nasal washings and aspirates are unacceptable for Xpert Xpress SARS-CoV-2/FLU/RSV testing.  Fact Sheet for Patients: EntrepreneurPulse.com.au  Fact Sheet for Healthcare Providers: IncredibleEmployment.be  This test is not yet approved or cleared by the [place] FDA and has been authorized for detection and/or diagnosis of SARS-CoV-2 by FDA under an Emergency Use Authorization (EUA). This EUA will remain in effect (meaning this test can be used) for the duration of the COVID-19 declaration under Section 564(b)(1) of the Act, 21 U.S.C. section 360bbb-3(b)(1), unless the authorization  is terminated or revoked.  Performed at [facility], [place] [address], [place], [place] [address]   MRSA Next Gen by PCR, Nasal     Status: None   Collection Time: 07/04/21  8:53 AM   Specimen: Nasal Mucosa; Nasal Swab  Result Value Ref Range Status   MRSA by PCR Next Gen NOT DETECTED NOT DETECTED Final    Comment: (NOTE) The GeneXpert MRSA Assay (FDA approved for NASAL specimens only), is one component of a comprehensive MRSA colonization surveillance program. It is not intended to diagnose MRSA infection nor to guide or monitor treatment for MRSA infections. Test performance is not FDA approved in patients less than 52 years old. Performed at [facility], [place] [address] [place], [place] [address]       Studies: CT HEAD WO CONTRAST (5MM)  Result Date: 07/08/2021 CLINICAL DATA:  Headache, uncontrolled hypertension, cocaine use EXAM: CT HEAD WITHOUT CONTRAST TECHNIQUE: Contiguous axial images were obtained from the base of the  skull through the vertex without intravenous contrast. COMPARISON:  None. FINDINGS: Brain: No evidence of acute infarction, hemorrhage, hydrocephalus, extra-axial collection or mass lesion/mass effect. Vascular: No hyperdense vessel or unexpected calcification. Skull: Normal. Negative for fracture or focal lesion. Sinuses/Orbits: No acute finding. Other: None. IMPRESSION: No acute intracranial pathology. No non-contrast CT findings to explain headache. Electronically Signed   By: Delanna Ahmadi M.D.   On: 07/08/2021 17:55    Scheduled Meds:  chlorhexidine gluconate (MEDLINE KIT)  15 mL Mouth Rinse BID   Chlorhexidine Gluconate Cloth  6 each Topical Q0600   enoxaparin (LOVENOX) injection  40 mg Subcutaneous Q24H   hydrochlorothiazide  50 mg Oral Daily   insulin aspart  0-15 Units Subcutaneous TID WC   loratadine  10 mg Oral Daily   losartan  100 mg Oral Daily   mouth rinse  15 mL Mouth Rinse 10 times per day   pantoprazole  40 mg Oral Daily   polyethylene glycol  17 g Per Tube Daily    Continuous Infusions:  sodium chloride 250 mL (07/05/21 1155)     LOS: 5 days     [name], MD [facility] Pager [phone]  If 7PM-7AM, please contact night-coverage [email or web address] Password [facility] 07/09/2021, 4:45 PM

## 2021-07-10 LAB — GLUCOSE, CAPILLARY
Glucose-Capillary: 122 mg/dL — ABNORMAL HIGH (ref 70–99)
Glucose-Capillary: 130 mg/dL — ABNORMAL HIGH (ref 70–99)
Glucose-Capillary: 120 mg/dL — ABNORMAL HIGH (ref 70–99)
Glucose-Capillary: 179 mg/dL — ABNORMAL HIGH (ref 70–99)

## 2021-07-10 MED ORDER — AMLODIPINE BESYLATE 5 MG PO TABS: 5.0000 mg | ORAL_TABLET | Freq: Every day | ORAL | Status: DC

## 2021-07-10 MED ORDER — AMLODIPINE BESYLATE 5 MG PO TABS
2.5000 mg | ORAL_TABLET | Freq: Every day | ORAL | Status: DC
  Administered 2021-07-10 – 2021-07-11 (×2): 2.5 mg via ORAL
  Filled 2021-07-10 (×2): qty 1

## 2021-07-10 NOTE — Progress Notes (Addendum)
PROGRESS NOTE  Ellen Simmons TIR:[id number] DOB: [birth year] DOA: 07/04/2021 PCP: Lavinia Sharps, NP  HPI/Recap of past 35 hours: 60 year old woman with PMH HTN, cocaine abuse, who presented with worsening shortness of breath and respiratory distress.  Upon presentation to the ED she was in hypertensive emergency with associated flash pulmonary edema.  UDS +cocaine +THC.  She was subsequently intubated in the emergency room and admitted to the ICU.  Extubated on 07/05/21 to 2L [place].  BP improved after restarting home oral antihypertensives.  TRH assumed care on 07/06/21.  On HCTZ, losartan, and hydralazine for blood pressure control.  She was seen by cardiology, provided recommendations, signed off.  Recommended follow-up outpatient in 3 months we will repeat a 2D echo.  Patient has been advised and counseled on the importance of polysubstance abuse cessation, she was receptive.  Hospital course complicated by acute drop in blood pressure on 07/08/2021.  Associated with restlessness, headache, generalized weakness, fatigue and positive orthostatic hypotension.  Rapid response was called, patient received 1 L of IV fluid bolus for blood pressure of 62/50.  She continued with maintenance IV fluid overnight which was discontinued on 07/09/2021.   07/10/21: Seen at her bedside.  Feels like she is having hot flashes.  BPs elevated.  Started Norvasc.  Assessment/Plan: Active Problems:   Acute respiratory failure with hypoxia (HCC)   Hypertensive emergency   Substance abuse (HCC)   AKI (acute kidney injury) (HCC)  Resolved acute hypoxic hypercarbic respiratory failure secondary to flash pulmonary edema in the setting of hypertensive emergency. Not on oxygen supplementation at baseline. Presented with dyspnea, hypoxia, with severely elevated blood pressures and flash pulmonary edema on chest x-ray. Subsequently intubated in the ED and transferred to the ICU. She was extubated on 07/05/21 to 2  L nasal cannula. Currently on room air with O2 saturation 99%.  Resolved hypertensive emergency with flash pulmonary edema Bidil held due to hypotensive episode on 07/08/2021 She is currently on hydrochlorothiazide to 50 mg daily and on losartan 100 mg daily. BP uncontrolled, elevated.  Started Norvasc 2.5 mg daily. Continue to closely monitor vital signs. IV antihypertensive as needed with parameters.  Improved, dizziness/headache Tylenol for headaches She had positive orthostatic vital signs on 07/08/2021, received IV fluid hydration. Continue fall precautions. She was assessed by PT OT with no further recommendations.  Acute systolic CHF in the setting of cocaine abuse 2D echo done on 07/05/21 LVEF 45-50%, LV global hypokinesis Seen by cardiology, recommended repeat 2D echo in 3 months, to avoid cocaine. Continue strict I&O and daily weight (urine output not documented)  Cocaine withdrawal + Restlessness, headaches, generalized fatigue UDS on 07/04/2021 positive for cocaine. Repeated UDS on 07/09/2021, negative for cocaine, positive for THC. Seen by psychiatry, recommendation for hydroxyzine 25 mg 3 times daily as needed for anxiety.  QTC prolongation Presenting QTC on admission 606 on 07/04/2021. Repeated QTC 536 on 07/09/2021. Avoid QTC prolonging agents Continue to optimize potassium and magnesium levels.  Type 2 diabetes with hyperglycemia Hemoglobin A1c 6.3 on 07/05/2021 Continue insulin sliding scale.  Resolved AKI Baseline creatinine appears to be 0.6 with a GFR of greater than 60 Creatinine back to baseline from 1.18 with GFR 54. Continue to avoid nephrotoxic agents. Monitor urine output with strict I's and O's  Polysubstance abuse including cocaine and THC UDS positive for cocaine on UDS on 07/04/2021 Repeated UDS on 07/09/2021 positive for THC. Polysubstance cessation counseling done at bedside. She is receptive to cessation. TOC consulted to assist with  providing resources.  Resolved toxic encephalopathy in the setting of cocaine THC abuse Mentation is at baseline  Constipation Senokot 2 tablets twice daily, MiraLAX as needed.   Code Status: Full code  Family Communication: None at bedside.  Disposition Plan: Likely will discharge to home once blood pressure is controlled, likely on 07/10/2021.   Consultants: PCCM Cardiology, signed off.  Procedures: Intubation Extubation  Antimicrobials: None  DVT prophylaxis: Subcu Lovenox daily  Status is: Inpatient  Inpatient.  Patient will require at least 2 midnights for further evaluation and treatment of present condition.      Objective: Vitals:   07/10/21 0500 07/10/21 0514 07/10/21 1208 07/10/21 1341  BP:  (!) 153/93 (!) 152/83 136/80  Pulse:  72 81   Resp:  16    Temp:  97.9 F (36.6 C)    TempSrc:  Oral    SpO2:  98%    Weight: 72.9 kg     Height:       No intake or output data in the 24 hours ending 07/10/21 1635  Filed Weights   07/08/21 0500 07/09/21 0500 07/10/21 0500  Weight: 71.6 kg 72.1 kg 72.9 kg    Exam: No significant changes from prior exam.  General: 60 y.o. year-old female well-developed well-nourished in no acute distress.  She is alert and oriented x3.  Cardiovascular: Regular rate and rhythm no rubs or gallops.  Respiratory: Clear to auscultation with no wheezes or rales. Abdomen: Soft nontender normal bowel sounds present.  Musculoskeletal: No lower extremity edema bilaterally.   Skin: No ulcerative lesions noted.   Psychiatry: Mood is appropriate for condition and setting.  Data Reviewed: CBC: Recent Labs  Lab 07/04/21 0640 07/05/21 0233 07/08/21 1055 07/09/21 1152  WBC 17.4* 13.2* 9.0 9.3  NEUTROABS 10.1*  --  4.5  --   HGB 16.2* 13.8 17.1* 15.0  HCT 50.8* 42.6 51.9* 45.9  MCV 84.4 83.2 81.7 82.7  PLT 268 251 321 324   Basic Metabolic Panel: Recent Labs  Lab 07/04/21 0640 07/04/21 1656 07/05/21 0233 07/05/21 1556  07/07/21 0502 07/08/21 0523 07/09/21 1152  NA 141  --  141  --  135 134* 134*  K 3.7  --  3.8  --  2.8* 4.4 3.9  CL 106  --  108  --  95* 99 101  CO2 24  --  24  --  28 25 25   GLUCOSE 285*  --  175*  --  138* 127* 131*  BUN 19  --  29*  --  14 18 22*  CREATININE 1.08*  --  1.18*  --  0.65 0.73 0.83  CALCIUM 8.9  --  8.8*  --  9.5 9.6 9.4  MG 2.4  --  2.0  --  1.7  --  2.0  PHOS 5.5* 4.7* 3.8 3.4  --   --  4.2   GFR: Estimated Creatinine Clearance: 69.9 mL/min (by C-G formula based on SCr of 0.83 mg/dL). Liver Function Tests: Recent Labs  Lab 07/04/21 0640 07/08/21 0523 07/09/21 1152  AST 280* 46* 51*  ALT 191* 65* 59*  ALKPHOS 184* 141* 133*  BILITOT 0.8 0.7 0.5  PROT 8.2* 7.8 7.7  ALBUMIN 4.2 4.1 3.9   No results for input(s): LIPASE, AMYLASE in the last 168 hours. No results for input(s): AMMONIA in the last 168 hours. Coagulation Profile: No results for input(s): INR, PROTIME in the last 168 hours. Cardiac Enzymes: No results for input(s): CKTOTAL, CKMB, CKMBINDEX, TROPONINI in  the last 168 hours. BNP (last 3 results) No results for input(s): PROBNP in the last 8760 hours. HbA1C: No results for input(s): HGBA1C in the last 72 hours.  CBG: Recent Labs  Lab 07/09/21 1140 07/09/21 1724 07/09/21 2010 07/10/21 0733 07/10/21 1144  GLUCAP 93 120* 116* 122* 130*   Lipid Profile: No results for input(s): CHOL, HDL, LDLCALC, TRIG, CHOLHDL, LDLDIRECT in the last 72 hours.  Thyroid Function Tests: Recent Labs    07/08/21 1055  TSH 1.350   Anemia Panel: No results for input(s): VITAMINB12, FOLATE, FERRITIN, TIBC, IRON, RETICCTPCT in the last 72 hours. Urine analysis:    Component Value Date/Time   COLORURINE STRAW (A) 07/04/2021 0750   APPEARANCEUR HAZY (A) 07/04/2021 0750   LABSPEC 1.006 07/04/2021 0750   PHURINE 6.0 07/04/2021 0750   GLUCOSEU >=500 (A) 07/04/2021 0750   HGBUR SMALL (A) 07/04/2021 0750   BILIRUBINUR NEGATIVE 07/04/2021 0750   KETONESUR  NEGATIVE 07/04/2021 0750   PROTEINUR 100 (A) 07/04/2021 0750   NITRITE NEGATIVE 07/04/2021 0750   LEUKOCYTESUR NEGATIVE 07/04/2021 0750   Sepsis Labs: @LABRCNTIP (procalcitonin:4,lacticidven:4)  ) Recent Results (from the past 240 hour(s))  Resp Panel by RT-PCR (Flu A&B, Covid) Nasopharyngeal Swab     Status: None   Collection Time: 07/04/21  6:40 AM   Specimen: Nasopharyngeal Swab; Nasopharyngeal(NP) swabs in vial transport medium  Result Value Ref Range Status   SARS Coronavirus 2 by RT PCR NEGATIVE NEGATIVE Final    Comment: (NOTE) SARS-CoV-2 target nucleic acids are NOT DETECTED.  The SARS-CoV-2 RNA is generally detectable in upper respiratory specimens during the acute phase of infection. The lowest concentration of SARS-CoV-2 viral copies this assay can detect is 138 copies/mL. A negative result does not preclude SARS-Cov-2 infection and should not be used as the sole basis for treatment or other patient management decisions. A negative result may occur with  improper specimen collection/handling, submission of specimen other than nasopharyngeal swab, presence of viral mutation(s) within the areas targeted by this assay, and inadequate number of viral copies(<138 copies/mL). A negative result must be combined with clinical observations, patient history, and epidemiological information. The expected result is Negative.  Fact Sheet for Patients:  07/06/21  Fact Sheet for Healthcare Providers:  BloggerCourse.com  This test is no t yet approved or cleared by the [employer] FDA and  has been authorized for detection and/or diagnosis of SARS-CoV-2 by FDA under an Emergency Use Authorization (EUA). This EUA will remain  in effect (meaning this test can be used) for the duration of the COVID-19 declaration under Section 564(b)(1) of the Act, 21 U.S.C.section 360bbb-3(b)(1), unless the authorization is terminated  or  revoked sooner.       Influenza A by PCR NEGATIVE NEGATIVE Final   Influenza B by PCR NEGATIVE NEGATIVE Final    Comment: (NOTE) The Xpert Xpress SARS-CoV-2/FLU/RSV plus assay is intended as an aid in the diagnosis of influenza from Nasopharyngeal swab specimens and should not be used as a sole basis for treatment. Nasal washings and aspirates are unacceptable for Xpert Xpress SARS-CoV-2/FLU/RSV testing.  Fact Sheet for Patients: [place]  Fact Sheet for Healthcare Providers: BloggerCourse.com  This test is not yet approved or cleared by the [employer] FDA and has been authorized for detection and/or diagnosis of SARS-CoV-2 by FDA under an Emergency Use Authorization (EUA). This EUA will remain in effect (meaning this test can be used) for the duration of the COVID-19 declaration under Section 564(b)(1) of the Act, 21 U.S.C.  section 360bbb-3(b)(1), unless the authorization is terminated or revoked.  Performed at [facility], [address], [place], [place] [address]   MRSA Next Gen by PCR, Nasal     Status: None   Collection Time: 07/04/21  8:53 AM   Specimen: Nasal Mucosa; Nasal Swab  Result Value Ref Range Status   MRSA by PCR Next Gen NOT DETECTED NOT DETECTED Final    Comment: (NOTE) The GeneXpert MRSA Assay (FDA approved for NASAL specimens only), is one component of a comprehensive MRSA colonization surveillance program. It is not intended to diagnose MRSA infection nor to guide or monitor treatment for MRSA infections. Test performance is not FDA approved in patients less than 11 years old. Performed at [facility], [address] [place], [place] [address]       Studies: No results found.  Scheduled Meds:  amLODipine  2.5 mg Oral Daily   enoxaparin (LOVENOX) injection  40 mg Subcutaneous Q24H   hydrochlorothiazide  50 mg Oral Daily   insulin aspart  0-15  Units Subcutaneous TID WC   loratadine  10 mg Oral Daily   losartan  100 mg Oral Daily   pantoprazole  40 mg Oral Daily   polyethylene glycol  17 g Per Tube Daily    Continuous Infusions:  sodium chloride 250 mL (07/05/21 1155)     LOS: 6 days     Darlin Drop, MD [facility] Pager [phone]  If 7PM-7AM, please contact night-coverage [email or web address] Password [facility] 07/10/2021, 4:35 PM

## 2021-07-10 NOTE — Plan of Care (Signed)
°  Problem: Clinical Measurements: °Goal: Cardiovascular complication will be avoided °Outcome: Progressing °  °Problem: Activity: °Goal: Risk for activity intolerance will decrease °Outcome: Progressing °  °Problem: Coping: °Goal: Level of anxiety will decrease °Outcome: Progressing °  °

## 2021-07-11 LAB — GLUCOSE, CAPILLARY
Glucose-Capillary: 142 mg/dL — ABNORMAL HIGH (ref 70–99)
Glucose-Capillary: 198 mg/dL — ABNORMAL HIGH (ref 70–99)
Glucose-Capillary: 168 mg/dL — ABNORMAL HIGH (ref 70–99)
Glucose-Capillary: 212 mg/dL — ABNORMAL HIGH (ref 70–99)

## 2021-07-11 MED ORDER — AMLODIPINE BESYLATE 5 MG PO TABS: 5.0000 mg | ORAL_TABLET | Freq: Every day | ORAL | Status: DC

## 2021-07-11 MED ORDER — BISACODYL 10 MG RE SUPP
10.0000 mg | Freq: Once | RECTAL | Status: DC
  Filled 2021-07-11: qty 1

## 2021-07-11 MED ORDER — ADULT MULTIVITAMIN W/MINERALS CH
1.0000 | ORAL_TABLET | Freq: Every day | ORAL | Status: DC
Start: 2021-07-11 — End: 2021-07-12
  Administered 2021-07-11 – 2021-07-12 (×2): 1 via ORAL
  Filled 2021-07-11 (×2): qty 1

## 2021-07-11 MED ORDER — AMLODIPINE BESYLATE 5 MG PO TABS
2.5000 mg | ORAL_TABLET | ORAL | Status: AC
  Administered 2021-07-11: 2.5 mg via ORAL
  Filled 2021-07-11: qty 1

## 2021-07-11 MED ORDER — ENSURE ENLIVE PO LIQD
237.0000 mL | Freq: Two times a day (BID) | ORAL | Status: DC
  Administered 2021-07-11 – 2021-07-12 (×2): 237 mL via ORAL

## 2021-07-11 MED ORDER — SENNOSIDES-DOCUSATE SODIUM 8.6-50 MG PO TABS
2.0000 | ORAL_TABLET | Freq: Two times a day (BID) | ORAL | Status: DC
  Administered 2021-07-11 – 2021-07-12 (×3): 2 via ORAL
  Filled 2021-07-11 (×3): qty 2

## 2021-07-11 NOTE — Progress Notes (Signed)
Nutrition Follow-up  DOCUMENTATION CODES:  Not applicable  INTERVENTION:  Add Ensure Plus High Protein po BID, each supplement provides 350 kcal and 20 grams of protein.   Add MVI with minerals daily.  Consider starting a bowel regimen.  NUTRITION DIAGNOSIS:  Inadequate oral intake related to inability to eat as evidenced by NPO status. - resolved  GOAL:  Patient will meet greater than or equal to 90% of their needs. - ongoing  MONITOR:  Vent status, TF tolerance, Labs, Weight trends  REASON FOR ASSESSMENT:  Ventilator, Consult Enteral/tube feeding initiation and management  ASSESSMENT:  60 year old female with medical history of HTN and hemorrhoids. She presented to the ED via EMS overnight on 11/15 due to shortness of breath. She reported having a cough x5 days. She required intubation in the ED. 11/15 - intubated, OGT placed 11/16 - extubated, OGT removed  Likely will discharge to home once blood pressure is controlled.  Pt with limited meal documentation, but appears to be eating 100% of all meals.  Admit wt: 68.9 kg  Current wt: 74.9 kg  Of note, pt with no edema.  Also, of note, pt has not had a BM in 5 days. RD to message MD to notify.  Recommend adding Ensure BID and MVI with minerals daily.  Medications: reviewed; SSI, Protonix, miralax  Labs: reviewed  Diet Order:   Diet Order             Diet Heart Room service appropriate? Yes; Fluid consistency: Thin  Diet effective now                  EDUCATION NEEDS:  No education needs have been identified at this time  Skin:  Skin Assessment: Reviewed RN Assessment  Last BM:  07/06/21  Height:  Ht Readings from Last 1 Encounters:  07/04/21 5\' 2"  (1.575 m)   Weight:  Wt Readings from Last 1 Encounters:  07/11/21 74.9 kg   BMI:  Body mass index is 30.2 kg/m.  Estimated Nutritional Needs:  Kcal:  1800-2000 Protein:  95-110 grams Fluid:  >1.8 L  07/13/21, RD, LDN  (she/her/hers) Clinical Inpatient Dietitian RD Pager/After-Hours/Weekend Pager # in [place]

## 2021-07-11 NOTE — Progress Notes (Addendum)
PROGRESS NOTE  Ellen Simmons TMA:[id number] DOB: 1960/11/06 DOA: 07/04/2021 PCP: Lavinia Sharps, NP  HPI/Recap of past 69 hours: 60 year old woman with PMH HTN, cocaine abuse, who presented with worsening shortness of breath and respiratory distress.  Upon presentation to the ED she was in hypertensive emergency with associated flash pulmonary edema.  UDS +cocaine +THC.  She was subsequently intubated in the emergency room and admitted to the ICU.  Extubated on 07/05/21 to 2L [place].  BP improved after restarting her home oral antihypertensives.  TRH assumed care on 07/06/21.    On home HCTZ, losartan for blood pressure control which has been labile.  She was seen by cardiology, provided recommendations, signed off.  Recommended follow-up outpatient in 3 months, will repeat a 2D echo.  Patient has been advised and counseled on the importance of polysubstance abuse cessation, she was receptive.  Hospital course complicated by acute drop in blood pressure on 07/08/2021.  Associated with restlessness, headache, generalized weakness, fatigue and positive orthostatic hypotension.  Rapid response was called, patient received 1 L of IV fluid bolus for blood pressure of 62/50 with improvement.  She continued with maintenance IV fluid overnight which was discontinued on 07/09/2021.  She became hypertensive again and Norvasc was added 5 mg daily.   07/11/21: Seen at her bedside.  Reports hot flashes.  Ongoing close monitoring of her blood pressure with addition of Norvasc.  Assessment/Plan: Active Problems:   Acute respiratory failure with hypoxia (HCC)   Hypertensive emergency   Substance abuse (HCC)   AKI (acute kidney injury) (HCC)  Resolved acute hypoxic hypercarbic respiratory failure secondary to flash pulmonary edema in the setting of hypertensive emergency. Not on oxygen supplementation at baseline. Presented with dyspnea, hypoxia, with severely elevated blood pressures and flash  pulmonary edema on chest x-ray. Subsequently intubated in the ED and transferred to the ICU. She was extubated on 07/05/21 to 2 L nasal cannula. Currently on room air with O2 saturation 99%.  Hypertensive emergency with flash pulmonary edema, BP is labile, ongoing titration of BP medications. Bidil held due to hypotensive episode on 07/08/2021 She is currently on hydrochlorothiazide 250 mg daily and on losartan 100 mg daily. Norvasc 5 mg daily added Continue to closely monitor vital signs. Avoid correction with IV antihypertensive in anticipation for DC.  Improved, dizziness/headache Tylenol for headaches as needed. She had positive orthostatic vital signs on 07/08/2021, received IV fluid hydration. Continue fall precautions. She was assessed by PT OT with no further recommendations.  Acute systolic CHF in the setting of cocaine abuse 2D echo done on 07/05/21 LVEF 45-50%, LV global hypokinesis Seen by cardiology, recommended repeat 2D echo in 3 months, to avoid cocaine. Continue strict I&O and daily weight (urine output not documented)  Cocaine withdrawal + Restlessness, headaches, generalized fatigue UDS on 07/04/2021 positive for cocaine. Repeated UDS on 07/09/2021, negative for cocaine, positive for THC. Seen by psychiatry, recommendation for hydroxyzine 25 mg 3 times daily as needed for anxiety.  QTC prolongation Presenting QTC on admission 606 on 07/04/2021. Repeated QTC 536 on 07/09/2021. Avoid QTC prolonging agents Continue to optimize potassium and magnesium levels.  Elevated liver chemistries Acute hepatitis panel negative. LFTs are downtrending Avoid hepatotoxic agents  Type 2 diabetes with hyperglycemia Hemoglobin A1c 6.3 on 07/05/2021 Continue insulin sliding scale.  Resolved AKI Baseline creatinine appears to be 0.6 with a GFR of greater than 60 Creatinine back to baseline from 1.18 with GFR 54. Continue to avoid nephrotoxic agents. Monitor urine output  with strict I's and O's  Polysubstance abuse including cocaine and THC UDS positive for cocaine on UDS on 07/04/2021 Repeated UDS on 07/09/2021 positive for THC. Polysubstance cessation counseling done at bedside. She is receptive to cessation. TOC consulted to assist with providing resources.  Resolved toxic encephalopathy in the setting of cocaine THC abuse Mentation is at baseline  Resolved hypotension with hypovolemia On 07/08/21 with blood pressure of 62/50. Resolved after 1 L IV fluid hydration on 07/08/2021. Avoid dehydration, off IV fluid to avoid volume overload with EF of 45 to 50%.  Constipation Senokot 2 tablets twice daily, MiraLAX as needed.     Code Status: Full code  Family Communication: None at bedside.  Disposition Plan: Likely will discharge to home once blood pressure is controlled, likely on 07/12/2021.   Consultants: PCCM Cardiology, signed off.  Procedures: Intubation Extubation  Antimicrobials: None  DVT prophylaxis: Subcu Lovenox daily  Status is: Inpatient  Inpatient.  Patient will require at least 2 midnights for further evaluation and treatment of present condition.      Objective: Vitals:   07/11/21 0625 07/11/21 0735 07/11/21 0908 07/11/21 1250  BP: (!) 174/101 132/85 (!) 167/96 (!) 145/92  Pulse: 80   90  Resp: 16   18  Temp: 98.6 F (37 C)   98 F (36.7 C)  TempSrc: Oral   Oral  SpO2: 97%   98%  Weight:      Height:       No intake or output data in the 24 hours ending 07/11/21 1743  Filed Weights   07/09/21 0500 07/10/21 0500 07/11/21 0500  Weight: 72.1 kg 72.9 kg 74.9 kg    Exam:   General: 60 y.o. year-old female well-developed well-nourished in no acute distress.  She is alert oriented x3.  Cardiovascular: Regular rate and rhythm no rubs or gallops.  No JVD or thyromegaly noted. Respiratory: Clear to auscultation with no wheezes or rales.  Abdomen: Soft nontender normal bowel sounds present.    Musculoskeletal: No lower extremity edema bilaterally. Skin: No ulcerative lesions noted.   Psychiatry: Mood is appropriate for condition and setting.  Data Reviewed: CBC: Recent Labs  Lab 07/05/21 0233 07/08/21 1055 07/09/21 1152  WBC 13.2* 9.0 9.3  NEUTROABS  --  4.5  --   HGB 13.8 17.1* 15.0  HCT 42.6 51.9* 45.9  MCV 83.2 81.7 82.7  PLT 251 321 324   Basic Metabolic Panel: Recent Labs  Lab 07/05/21 0233 07/05/21 1556 07/07/21 0502 07/08/21 0523 07/09/21 1152  NA 141  --  135 134* 134*  K 3.8  --  2.8* 4.4 3.9  CL 108  --  95* 99 101  CO2 24  --  28 25 25   GLUCOSE 175*  --  138* 127* 131*  BUN 29*  --  14 18 22*  CREATININE 1.18*  --  0.65 0.73 0.83  CALCIUM 8.8*  --  9.5 9.6 9.4  MG 2.0  --  1.7  --  2.0  PHOS 3.8 3.4  --   --  4.2   GFR: Estimated Creatinine Clearance: 70.8 mL/min (by C-G formula based on SCr of 0.83 mg/dL). Liver Function Tests: Recent Labs  Lab 07/08/21 0523 07/09/21 1152  AST 46* 51*  ALT 65* 59*  ALKPHOS 141* 133*  BILITOT 0.7 0.5  PROT 7.8 7.7  ALBUMIN 4.1 3.9   No results for input(s): LIPASE, AMYLASE in the last 168 hours. No results for input(s): AMMONIA in the last 168  hours. Coagulation Profile: No results for input(s): INR, PROTIME in the last 168 hours. Cardiac Enzymes: No results for input(s): CKTOTAL, CKMB, CKMBINDEX, TROPONINI in the last 168 hours. BNP (last 3 results) No results for input(s): PROBNP in the last 8760 hours. HbA1C: No results for input(s): HGBA1C in the last 72 hours.  CBG: Recent Labs  Lab 07/10/21 1701 07/10/21 2147 07/11/21 0737 07/11/21 1204 07/11/21 1705  GLUCAP 120* 179* 142* 198* 168*   Lipid Profile: No results for input(s): CHOL, HDL, LDLCALC, TRIG, CHOLHDL, LDLDIRECT in the last 72 hours.  Thyroid Function Tests: No results for input(s): TSH, T4TOTAL, FREET4, T3FREE, THYROIDAB in the last 72 hours.  Anemia Panel: No results for input(s): VITAMINB12, FOLATE, FERRITIN, TIBC,  IRON, RETICCTPCT in the last 72 hours. Urine analysis:    Component Value Date/Time   COLORURINE STRAW (A) 07/04/2021 0750   APPEARANCEUR HAZY (A) 07/04/2021 0750   LABSPEC 1.006 07/04/2021 0750   PHURINE 6.0 07/04/2021 0750   GLUCOSEU >=500 (A) 07/04/2021 0750   HGBUR SMALL (A) 07/04/2021 0750   BILIRUBINUR NEGATIVE 07/04/2021 0750   KETONESUR NEGATIVE 07/04/2021 0750   PROTEINUR 100 (A) 07/04/2021 0750   NITRITE NEGATIVE 07/04/2021 0750   LEUKOCYTESUR NEGATIVE 07/04/2021 0750   Sepsis Labs: @LABRCNTIP (procalcitonin:4,lacticidven:4)  ) Recent Results (from the past 240 hour(s))  Resp Panel by RT-PCR (Flu A&B, Covid) Nasopharyngeal Swab     Status: None   Collection Time: 07/04/21  6:40 AM   Specimen: Nasopharyngeal Swab; Nasopharyngeal(NP) swabs in vial transport medium  Result Value Ref Range Status   SARS Coronavirus 2 by RT PCR NEGATIVE NEGATIVE Final    Comment: (NOTE) SARS-CoV-2 target nucleic acids are NOT DETECTED.  The SARS-CoV-2 RNA is generally detectable in upper respiratory specimens during the acute phase of infection. The lowest concentration of SARS-CoV-2 viral copies this assay can detect is 138 copies/mL. A negative result does not preclude SARS-Cov-2 infection and should not be used as the sole basis for treatment or other patient management decisions. A negative result may occur with  improper specimen collection/handling, submission of specimen other than nasopharyngeal swab, presence of viral mutation(s) within the areas targeted by this assay, and inadequate number of viral copies(<138 copies/mL). A negative result must be combined with clinical observations, patient history, and epidemiological information. The expected result is Negative.  Fact Sheet for Patients:  07/06/21  Fact Sheet for Healthcare Providers:  BloggerCourse.com  This test is no t yet approved or cleared by the [employer] FDA and  has been authorized for detection and/or diagnosis of SARS-CoV-2 by FDA under an Emergency Use Authorization (EUA). This EUA will remain  in effect (meaning this test can be used) for the duration of the COVID-19 declaration under Section 564(b)(1) of the Act, 21 U.S.C.section 360bbb-3(b)(1), unless the authorization is terminated  or revoked sooner.       Influenza A by PCR NEGATIVE NEGATIVE Final   Influenza B by PCR NEGATIVE NEGATIVE Final    Comment: (NOTE) The Xpert Xpress SARS-CoV-2/FLU/RSV plus assay is intended as an aid in the diagnosis of influenza from Nasopharyngeal swab specimens and should not be used as a sole basis for treatment. Nasal washings and aspirates are unacceptable for Xpert Xpress SARS-CoV-2/FLU/RSV testing.  Fact Sheet for Patients: [place]  Fact Sheet for Healthcare Providers: BloggerCourse.com  This test is not yet approved or cleared by the [employer] FDA and has been authorized for detection and/or diagnosis of SARS-CoV-2 by FDA under an Emergency Use Authorization (  EUA). This EUA will remain in effect (meaning this test can be used) for the duration of the COVID-19 declaration under Section 564(b)(1) of the Act, 21 U.S.C. section 360bbb-3(b)(1), unless the authorization is terminated or revoked.  Performed at [facility] [address], [place], [place] [address]   MRSA Next Gen by PCR, Nasal     Status: None   Collection Time: 07/04/21  8:53 AM   Specimen: Nasal Mucosa; Nasal Swab  Result Value Ref Range Status   MRSA by PCR Next Gen NOT DETECTED NOT DETECTED Final    Comment: (NOTE) The GeneXpert MRSA Assay (FDA approved for NASAL specimens only), is one component of a comprehensive MRSA colonization surveillance program. It is not intended to diagnose MRSA infection nor to guide or monitor treatment for MRSA infections. Test performance is  not FDA approved in patients less than 72 years old. Performed at [facility], [address], [place], [place] [address]       Studies: No results found.  Scheduled Meds:  amLODipine  2.5 mg Oral STAT   [START ON 07/12/2021] amLODipine  5 mg Oral Daily   bisacodyl  10 mg Rectal Once   enoxaparin (LOVENOX) injection  40 mg Subcutaneous Q24H   feeding supplement  237 mL Oral BID BM   hydrochlorothiazide  50 mg Oral Daily   insulin aspart  0-15 Units Subcutaneous TID WC   loratadine  10 mg Oral Daily   losartan  100 mg Oral Daily   multivitamin with minerals  1 tablet Oral Daily   pantoprazole  40 mg Oral Daily   polyethylene glycol  17 g Per Tube Daily   senna-docusate  2 tablet Oral BID    Continuous Infusions:  sodium chloride 250 mL (07/05/21 1155)     LOS: 7 days     Darlin Drop, MD [facility] Pager [phone]  If 7PM-7AM, please contact night-coverage [email or web address] Password [facility] 07/11/2021, 5:43 PM

## 2021-07-11 NOTE — Plan of Care (Signed)

## 2021-07-11 NOTE — Plan of Care (Signed)
  Problem: Clinical Measurements: Goal: Respiratory complications will improve Outcome: Progressing Goal: Cardiovascular complication will be avoided Outcome: Progressing   Problem: Activity: Goal: Risk for activity intolerance will decrease 07/11/2021 0754 by Val Eagle, RN Outcome: Progressing 07/10/2021 1835 by Val Eagle, RN Outcome: Progressing   Problem: Nutrition: Goal: Adequate nutrition will be maintained Outcome: Progressing   Problem: Coping: Goal: Level of anxiety will decrease 07/11/2021 0754 by Val Eagle, RN Outcome: Progressing 07/10/2021 1835 by Val Eagle, RN Outcome: Progressing

## 2021-07-12 LAB — GLUCOSE, CAPILLARY
Glucose-Capillary: 129 mg/dL — ABNORMAL HIGH (ref 70–99)
Glucose-Capillary: 87 mg/dL (ref 70–99)
Glucose-Capillary: 239 mg/dL — ABNORMAL HIGH (ref 70–99)

## 2021-07-12 MED ORDER — LOSARTAN POTASSIUM 100 MG PO TABS: 100.0000 mg | ORAL_TABLET | Freq: Every day | ORAL | 0 refills | Status: DC

## 2021-07-12 MED ORDER — POLYETHYLENE GLYCOL 3350 17 G PO PACK: 17.0000 g | PACK | Freq: Every day | ORAL | 0 refills | Status: AC | PRN

## 2021-07-12 MED ORDER — AMLODIPINE BESYLATE 5 MG PO TABS
5.0000 mg | ORAL_TABLET | ORAL | Status: AC
  Administered 2021-07-12: 5 mg via ORAL
  Filled 2021-07-12: qty 1

## 2021-07-12 MED ORDER — HYDROCHLOROTHIAZIDE 50 MG PO TABS: 50.0000 mg | ORAL_TABLET | Freq: Every day | ORAL | 0 refills | Status: DC

## 2021-07-12 MED ORDER — AMLODIPINE BESYLATE 5 MG PO TABS: 5.0000 mg | ORAL_TABLET | Freq: Every day | ORAL | 0 refills | Status: DC

## 2021-07-12 MED ORDER — ADULT MULTIVITAMIN W/MINERALS CH
1.0000 | ORAL_TABLET | Freq: Every day | ORAL | 0 refills | Status: AC
Start: 2021-07-12 — End: 2021-10-10

## 2021-07-12 MED ORDER — HYDROXYZINE HCL 25 MG PO TABS: 25.0000 mg | ORAL_TABLET | Freq: Three times a day (TID) | ORAL | 0 refills | Status: AC | PRN

## 2021-07-12 MED ORDER — OMEPRAZOLE 20 MG PO CPDR: 20.0000 mg | DELAYED_RELEASE_CAPSULE | Freq: Every day | ORAL | 0 refills | Status: AC

## 2021-07-12 NOTE — Plan of Care (Signed)
  Problem: Activity: Goal: Risk for activity intolerance will decrease Outcome: Progressing   Problem: Nutrition: Goal: Adequate nutrition will be maintained Outcome: Progressing   Problem: Coping: Goal: Level of anxiety will decrease Outcome: Progressing   Problem: Elimination: Goal: Will not experience complications related to bowel motility Outcome: Progressing   

## 2021-07-12 NOTE — Discharge Summary (Addendum)
Discharge Summary  Ellen Simmons [id number] DOB: 1961/01/16  PCP: [name], NP  Admit date: 07/04/2021 Discharge date: 07/12/2021  Time spent: 35 minutes  Recommendations for Outpatient Follow-up:  Follow-up with your primary care provider Take your medications as prescribed  Discharge Diagnoses:  Active Hospital Problems   Diagnosis Date Noted   Acute respiratory failure with hypoxia ([place]) 07/04/2021   Hypertensive emergency    Substance abuse ([place])    AKI (acute kidney injury) [facility])     Resolved Hospital Problems  No resolved problems to display.    Discharge Condition: Stable  Diet recommendation: Dash Diet.  Vitals:   07/12/21 0455 07/12/21 0911  BP: (!) 178/98 (!) 152/77  Pulse: 88   Resp: 18   Temp: 98.1 F (36.7 C)   SpO2: 96%     History of present illness:   60 year old woman with PMH HTN, cocaine abuse, who presented with worsening shortness of breath and respiratory distress.  Upon presentation to the ED she was in hypertensive emergency with associated flash pulmonary edema.  UDS +cocaine +THC.  She was subsequently intubated in the emergency room and admitted to the ICU.  Extubated on 07/05/21 to 2L [place].  BP improved after restarting her home oral antihypertensives.   TRH assumed care on 07/06/21.     On home HCTZ, losartan for blood pressure control which has been labile.  She was seen by cardiology, provided recommendations, signed off.  Recommended follow-up outpatient in 3 months, will repeat a 2D echo.  Patient has been advised and counseled on the importance of polysubstance abuse cessation, she was receptive.   Hospital course complicated by acute drop in blood pressure on 07/08/2021.  Associated with restlessness, headache, generalized weakness, fatigue and positive orthostatic hypotension.  Rapid response was called, patient received 1 L of IV fluid bolus for blood pressure of 62/50 with improvement.  She continued with maintenance  IV fluid overnight which was discontinued on 07/09/2021.  She became hypertensive again and Norvasc was added 5 mg daily.     07/12/21: Seen at bedside.  No new complaints.  Eager to go home.  Hospital Course:  Active Problems:   Acute respiratory failure with hypoxia (HCC)   Hypertensive emergency   Substance abuse ([place])   AKI (acute kidney injury) ([place])  Resolved acute hypoxic hypercarbic respiratory failure secondary to flash pulmonary edema in the setting of hypertensive emergency. Not on oxygen supplementation at baseline. Presented with dyspnea, hypoxia, with severely elevated blood pressures and flash pulmonary edema on chest x-ray. Subsequently intubated in the ED and transferred to the ICU. She was extubated on 07/05/21 to 2 L nasal cannula. Currently on room air with O2 saturation 99%.   Hypertensive emergency with flash pulmonary edema, BP is labile, ongoing titration of BP medications. Bidil held due to hypotensive episode on 07/08/2021 She is currently on hydrochlorothiazide 50 mg daily, losartan 100 mg daily, and Norvasc 5 mg daily Follow-up with your PCP.   Improved, dizziness/headache Tylenol for headaches as needed. She had positive orthostatic vital signs on 07/08/2021, received IV fluid hydration. Continue fall precautions. She was assessed by PT OT with no further recommendations.   Acute systolic CHF in the setting of cocaine abuse 2D echo done on 07/05/21 LVEF 45-50%, LV global hypokinesis Seen by cardiology, recommended repeat 2D echo in 3 months, to avoid cocaine. Continue strict I&O and daily weight (urine output not documented)   Cocaine withdrawal + Restlessness, headaches, generalized fatigue UDS on 07/04/2021 positive  for cocaine. Repeated UDS on 07/09/2021, negative for cocaine, positive for THC. Seen by psychiatry, recommendation for hydroxyzine 25 mg 3 times daily as needed for anxiety.   QTC prolongation Presenting QTC on admission 606 on  07/04/2021. Repeated QTC 536 on 07/09/2021. Avoid QTC prolonging agents Continue to optimize potassium and magnesium levels.   Elevated liver chemistries Acute hepatitis panel negative. LFTs are downtrending Avoid hepatotoxic agents   Type 2 diabetes with hyperglycemia Hemoglobin A1c 6.3 on 07/05/2021 Continue insulin sliding scale.   Resolved AKI Baseline creatinine appears to be 0.6 with a GFR of greater than 60 Creatinine back to baseline from 1.18 with GFR 54. Continue to avoid nephrotoxic agents. Monitor urine output with strict I's and O's   Polysubstance abuse including cocaine and THC UDS positive for cocaine on UDS on 07/04/2021 Repeated UDS on 07/09/2021 positive for THC. Polysubstance cessation counseling done at bedside. She is receptive to cessation. TOC consulted to assist with providing resources.   Resolved toxic encephalopathy in the setting of cocaine THC abuse Mentation is at baseline   Resolved hypotension with hypovolemia On 07/08/21 with blood pressure of 62/50. Resolved after 1 L IV fluid hydration on 07/08/2021. Avoid dehydration, off IV fluid to avoid volume overload with EF of 45 to 50%.   Constipation Received Senokot 2 tablets twice daily, MiraLAX as needed.         Code Status: Full code      Consultants: PCCM Cardiology, signed off.   Procedures: Intubation Extubation   Antimicrobials: None    Discharge Exam: BP (!) 152/77   Pulse 88   Temp 98.1 F (36.7 C) (Oral)   Resp 18   Ht 5\' 2"  (1.575 m)   Wt 74.8 kg   SpO2 96%   BMI 30.16 kg/m  General: 60 y.o. year-old female well developed well nourished in no acute distress.  Alert and oriented x3. Cardiovascular: Regular rate and rhythm with no rubs or gallops.  No thyromegaly or JVD noted.   Respiratory: Clear to auscultation with no wheezes or rales. Good inspiratory effort. Abdomen: Soft nontender nondistended with normal bowel sounds x4 quadrants. Musculoskeletal: No  lower extremity edema. 2/4 pulses in all 4 extremities. Skin: No ulcerative lesions noted or rashes, Psychiatry: Mood is appropriate for condition and setting  Discharge Instructions You were cared for by a hospitalist during your hospital stay. If you have any questions about your discharge medications or the care you received while you were in the hospital after you are discharged, you can call the unit and asked to speak with the hospitalist on call if the hospitalist that took care of you is not available. Once you are discharged, your primary care physician will handle any further medical issues. Please note that NO REFILLS for any discharge medications will be authorized once you are discharged, as it is imperative that you return to your primary care physician (or establish a relationship with a primary care physician if you do not have one) for your aftercare needs so that they can reassess your need for medications and monitor your lab values.   Allergies as of 07/12/2021   No Known Allergies      Medication List     STOP taking these medications    PARoxetine 10 MG tablet Commonly known as: PAXIL       TAKE these medications    acetaminophen 500 MG tablet Commonly known as: TYLENOL Take 1,000 mg by mouth every 6 (six) hours as needed for  mild pain.   amLODipine 5 MG tablet Commonly known as: NORVASC Take 1 tablet (5 mg total) by mouth daily.   cyclobenzaprine 10 MG tablet Commonly known as: FLEXERIL Take 10 mg by mouth at bedtime.   hydrochlorothiazide 50 MG tablet Commonly known as: HYDRODIURIL Take 1 tablet (50 mg total) by mouth daily. What changed:  medication strength how much to take   hydrOXYzine 25 MG tablet Commonly known as: ATARAX/VISTARIL Take 1 tablet (25 mg total) by mouth 3 (three) times daily as needed for anxiety.   loratadine 10 MG tablet Commonly known as: CLARITIN Take 10 mg by mouth daily.   losartan 100 MG tablet Commonly known as:  COZAAR Take 1 tablet (100 mg total) by mouth daily.   multivitamin with minerals Tabs tablet Take 1 tablet by mouth daily.   omeprazole 20 MG capsule Commonly known as: PRILOSEC Take 1 capsule (20 mg total) by mouth daily.   polyethylene glycol 17 g packet Commonly known as: MIRALAX / GLYCOLAX Place 17 g into feeding tube daily as needed for mild constipation.       No Known Allergies  Follow-up Information     [facility] Follow up.   Specialty: Behavioral Health Why: This [facility] facility has intensive outpatient groups. Contact information: [place] Follow up.   Specialty: Behavioral Health Why: They have IOP groups Contact information: [place] [address] [phone]         [facility] Follow up.   Why: They have IOP groups Contact information: [address], [place], [place] [address] Phone: [phone]                 The results of significant diagnostics from this hospitalization (including imaging, microbiology, ancillary and laboratory) are listed below for reference.    Significant Diagnostic Studies: CT HEAD WO CONTRAST (5MM)  Result Date: 07/08/2021 CLINICAL DATA:  Headache, uncontrolled hypertension, cocaine use EXAM: CT HEAD WITHOUT CONTRAST TECHNIQUE: Contiguous axial images were obtained from the base of the skull through the vertex without intravenous contrast. COMPARISON:  None. FINDINGS: Brain: No evidence of acute infarction, hemorrhage, hydrocephalus, extra-axial collection or mass lesion/mass effect. Vascular: No hyperdense vessel or unexpected calcification. Skull: Normal. Negative for fracture or focal lesion. Sinuses/Orbits: No acute finding. Other: None. IMPRESSION: No acute intracranial pathology. No non-contrast CT findings to explain headache. Electronically Signed   By: Delanna Ahmadi M.D.   On: 07/08/2021  17:55   DG Chest Port 1 View  Result Date: 07/04/2021 CLINICAL DATA:  60 year old female intubated. EXAM: PORTABLE CHEST 1 VIEW COMPARISON:  Portable chest 11/21/2009. FINDINGS: Portable AP supine view at 0621 hours. Endotracheal tube tip is about 14 mm above the carina in good position. Enteric tube terminates in the stomach, side hole at the gastric body. Diffuse coarse bilateral pulmonary opacity, most confluent at the left lung base. No pneumothorax or pleural effusion evident on these supine views. Mediastinal contours appear stable and within normal limits. No acute osseous abnormality identified. Negative visible bowel gas. IMPRESSION: 1. Endotracheal tube and enteric tube in good position. 2. Diffuse coarse bilateral pulmonary opacity, most confluent at the left lung base. Top differential considerations are viral/atypical bilateral pneumonia and acute pulmonary edema. Electronically Signed   By: Genevie Ann M.D.   On: 07/04/2021 06:41   ECHOCARDIOGRAM COMPLETE  Result Date: 07/05/2021    ECHOCARDIOGRAM REPORT  Patient Name:   Ellen Simmons Date of Exam: 07/05/2021 Medical Rec #:  [id number]         Height:       62.0 in Accession #:    [id number]        Weight:       150.1 lb Date of Birth:  1960/12/09        BSA:          1.692 m Patient Age:    59 years          BP:           136/74 mmHg Patient Gender: F                 HR:           80 bpm. Exam Location:  Inpatient Procedure: 2D Echo, Cardiac Doppler and Color Doppler Indications:    Dyspnea  History:        Patient has no prior history of Echocardiogram examinations.                 Risk Factors:Hypertension.  Sonographer:    Vanetta Shawl Referring Phys: [phone] [name] IMPRESSIONS  1. Left ventricular ejection fraction, by estimation, is 45 to 50%. The left ventricle has mildly decreased function. The left ventricle demonstrates global hypokinesis. There is mild left ventricular hypertrophy. Left ventricular diastolic parameters  are consistent with Grade I diastolic dysfunction (impaired relaxation).  2. Right ventricular systolic function is normal. The right ventricular size is normal. Tricuspid regurgitation signal is inadequate for assessing PA pressure.  3. Left atrial size was mild to moderately dilated.  4. The mitral valve is normal in structure. Trivial mitral valve regurgitation. No evidence of mitral stenosis.  5. The aortic valve is tricuspid. Aortic valve regurgitation is not visualized. No aortic stenosis is present.  6. The inferior vena cava is normal in size with <50% respiratory variability, suggesting right atrial pressure of 8 mmHg. FINDINGS  Left Ventricle: Left ventricular ejection fraction, by estimation, is 45 to 50%. The left ventricle has mildly decreased function. The left ventricle demonstrates global hypokinesis. The left ventricular internal cavity size was normal in size. There is  mild left ventricular hypertrophy. Left ventricular diastolic parameters are consistent with Grade I diastolic dysfunction (impaired relaxation). Right Ventricle: The right ventricular size is normal. No increase in right ventricular wall thickness. Right ventricular systolic function is normal. Tricuspid regurgitation signal is inadequate for assessing PA pressure. Left Atrium: Left atrial size was mild to moderately dilated. Right Atrium: Right atrial size was normal in size. Pericardium: There is no evidence of pericardial effusion. Mitral Valve: The mitral valve is normal in structure. Trivial mitral valve regurgitation. No evidence of mitral valve stenosis. Tricuspid Valve: The tricuspid valve is normal in structure. Tricuspid valve regurgitation is trivial. Aortic Valve: The aortic valve is tricuspid. Aortic valve regurgitation is not visualized. No aortic stenosis is present. Aortic valve mean gradient measures 3.0 mmHg. Aortic valve peak gradient measures 5.2 mmHg. Aortic valve area, by VTI measures 2.49 cm. Pulmonic Valve:  The pulmonic valve was normal in structure. Pulmonic valve regurgitation is not visualized. Aorta: The aortic root is normal in size and structure. Venous: The inferior vena cava is normal in size with less than 50% respiratory variability, suggesting right atrial pressure of 8 mmHg. IAS/Shunts: No atrial level shunt detected by color flow Doppler.  LEFT VENTRICLE PLAX 2D LVIDd:  5.00 cm      Diastology LVIDs:         3.30 cm      LV e' medial:    6.20 cm/s LV PW:         1.30 cm      LV E/e' medial:  13.5 LV IVS:        1.30 cm      LV e' lateral:   7.07 cm/s LVOT diam:     2.20 cm      LV E/e' lateral: 11.9 LV SV:         53 LV SV Index:   31 LVOT Area:     3.80 cm  LV Volumes (MOD) LV vol d, MOD A2C: 140.0 ml LV vol d, MOD A4C: 115.0 ml LV vol s, MOD A2C: 71.9 ml LV vol s, MOD A4C: 61.0 ml LV SV MOD A2C:     68.1 ml LV SV MOD A4C:     115.0 ml LV SV MOD BP:      60.8 ml RIGHT VENTRICLE             IVC RV Basal diam:  3.80 cm     IVC diam: 2.00 cm RV S prime:     12.40 cm/s LEFT ATRIUM             Index        RIGHT ATRIUM           Index LA diam:        4.00 cm 2.36 cm/m   RA Area:     16.90 cm LA Vol (A2C):   86.1 ml 50.88 ml/m  RA Volume:   43.80 ml  25.88 ml/m LA Vol (A4C):   51.0 ml 30.14 ml/m LA Biplane Vol: 67.3 ml 39.77 ml/m  AORTIC VALVE                    PULMONIC VALVE AV Area (Vmax):    2.48 cm     PV Vmax:       1.03 m/s AV Area (Vmean):   2.39 cm     PV Peak grad:  4.2 mmHg AV Area (VTI):     2.49 cm AV Vmax:           114.00 cm/s AV Vmean:          79.100 cm/s AV VTI:            0.212 m AV Peak Grad:      5.2 mmHg AV Mean Grad:      3.0 mmHg LVOT Vmax:         74.40 cm/s LVOT Vmean:        49.800 cm/s LVOT VTI:          0.139 m LVOT/AV VTI ratio: 0.66  AORTA Ao Root diam: 3.00 cm Ao Asc diam:  2.90 cm MITRAL VALVE MV Area (PHT): 4.06 cm    SHUNTS MV Decel Time: 187 msec    Systemic VTI:  0.14 m MV E velocity: 84.00 cm/s  Systemic Diam: 2.20 cm MV A velocity: 97.30 cm/s MV E/A  ratio:  0.86 Dalton McleanMD Electronically signed by Franki Monte Signature Date/Time: 07/05/2021/5:41:12 PM    Final     Microbiology: Recent Results (from the past 240 hour(s))  Resp Panel by RT-PCR (Flu A&B, Covid) Nasopharyngeal Swab     Status: None   Collection Time: 07/04/21  6:40 AM   Specimen: Nasopharyngeal  Swab; Nasopharyngeal(NP) swabs in vial transport medium  Result Value Ref Range Status   SARS Coronavirus 2 by RT PCR NEGATIVE NEGATIVE Final    Comment: (NOTE) SARS-CoV-2 target nucleic acids are NOT DETECTED.  The SARS-CoV-2 RNA is generally detectable in upper respiratory specimens during the acute phase of infection. The lowest concentration of SARS-CoV-2 viral copies this assay can detect is 138 copies/mL. A negative result does not preclude SARS-Cov-2 infection and should not be used as the sole basis for treatment or other patient management decisions. A negative result may occur with  improper specimen collection/handling, submission of specimen other than nasopharyngeal swab, presence of viral mutation(s) within the areas targeted by this assay, and inadequate number of viral copies(<138 copies/mL). A negative result must be combined with clinical observations, patient history, and epidemiological information. The expected result is Negative.  Fact Sheet for Patients:  EntrepreneurPulse.com.au  Fact Sheet for Healthcare Providers:  IncredibleEmployment.be  This test is no t yet approved or cleared by the [place] FDA and  has been authorized for detection and/or diagnosis of SARS-CoV-2 by FDA under an Emergency Use Authorization (EUA). This EUA will remain  in effect (meaning this test can be used) for the duration of the COVID-19 declaration under Section 564(b)(1) of the Act, 21 U.S.C.section 360bbb-3(b)(1), unless the authorization is terminated  or revoked sooner.       Influenza A by PCR NEGATIVE NEGATIVE  Final   Influenza B by PCR NEGATIVE NEGATIVE Final    Comment: (NOTE) The Xpert Xpress SARS-CoV-2/FLU/RSV plus assay is intended as an aid in the diagnosis of influenza from Nasopharyngeal swab specimens and should not be used as a sole basis for treatment. Nasal washings and aspirates are unacceptable for Xpert Xpress SARS-CoV-2/FLU/RSV testing.  Fact Sheet for Patients: EntrepreneurPulse.com.au  Fact Sheet for Healthcare Providers: IncredibleEmployment.be  This test is not yet approved or cleared by the [place] FDA and has been authorized for detection and/or diagnosis of SARS-CoV-2 by FDA under an Emergency Use Authorization (EUA). This EUA will remain in effect (meaning this test can be used) for the duration of the COVID-19 declaration under Section 564(b)(1) of the Act, 21 U.S.C. section 360bbb-3(b)(1), unless the authorization is terminated or revoked.  Performed at [facility], [place] [address] [place], [place] [address]   MRSA Next Gen by PCR, Nasal     Status: None   Collection Time: 07/04/21  8:53 AM   Specimen: Nasal Mucosa; Nasal Swab  Result Value Ref Range Status   MRSA by PCR Next Gen NOT DETECTED NOT DETECTED Final    Comment: (NOTE) The GeneXpert MRSA Assay (FDA approved for NASAL specimens only), is one component of a comprehensive MRSA colonization surveillance program. It is not intended to diagnose MRSA infection nor to guide or monitor treatment for MRSA infections. Test performance is not FDA approved in patients less than 51 years old. Performed at [facility], [place] [address], [place], [place] [address]      Labs: Basic Metabolic Panel: Recent Labs  Lab 07/07/21 0502 07/08/21 0523 07/09/21 1152  NA 135 134* 134*  K 2.8* 4.4 3.9  CL 95* 99 101  CO2 28 25 25   GLUCOSE 138* 127* 131*  BUN 14 18 22*  CREATININE 0.65 0.73 0.83  CALCIUM 9.5 9.6 9.4  MG 1.7  --   2.0  PHOS  --   --  4.2   Liver Function Tests: Recent Labs  Lab 07/08/21 0523 07/09/21  1152  AST 46* 51*  ALT 65* 59*  ALKPHOS 141* 133*  BILITOT 0.7 0.5  PROT 7.8 7.7  ALBUMIN 4.1 3.9   No results for input(s): LIPASE, AMYLASE in the last 168 hours. No results for input(s): AMMONIA in the last 168 hours. CBC: Recent Labs  Lab 07/08/21 1055 07/09/21 1152  WBC 9.0 9.3  NEUTROABS 4.5  --   HGB 17.1* 15.0  HCT 51.9* 45.9  MCV 81.7 82.7  PLT 321 324   Cardiac Enzymes: No results for input(s): CKTOTAL, CKMB, CKMBINDEX, TROPONINI in the last 168 hours. BNP: BNP (last 3 results) Recent Labs    07/04/21 0640  BNP 242.0*    ProBNP (last 3 results) No results for input(s): PROBNP in the last 8760 hours.  CBG: Recent Labs  Lab 07/11/21 1705 07/11/21 2114 07/12/21 0736 07/12/21 1200 07/12/21 1627  GLUCAP 168* 212* 129* 87 239*       Signed:  [name], MD [facility] 07/12/2021, 5:55 PM

## 2021-07-12 NOTE — Progress Notes (Signed)
Provided and discussed discharge instructions. Addressed all questions and concerns. IV removed intact.  ?Ellen Simmons N [name] ? ?

## 2021-07-12 NOTE — TOC Transition Note (Signed)
Transition of Care [facility]) - CM/SW Discharge Note   Patient Details  Name: Ellen Simmons MRN: [id number] Date of Birth: [birth year]  [facility]) CM/SW Contact:  Golda Acre, RN Phone Number: 07/12/2021, 9:15 AM   Clinical Narrative:    Patient dcd to home with self care and no toc needs kpresent.   Final next level of care: Home/Self Care Barriers to Discharge: No Barriers Identified   Patient Goals and CMS Choice Patient states their goals for this hospitalization and ongoing recovery are:: To get better CMS Medicare.gov Compare Post Acute Care list provided to:: Patient Choice offered to / list presented to : Patient  Discharge Placement                       Discharge Plan and Services In-house Referral: Clinical Social Work                                   Social Determinants of Health (SDOH) Interventions     Readmission Risk Interventions No flowsheet data found.

## 2021-12-13 ENCOUNTER — Ambulatory Visit: Payer: 59

## 2021-12-13 ENCOUNTER — Ambulatory Visit (INDEPENDENT_AMBULATORY_CARE_PROVIDER_SITE_OTHER): Payer: 59 | Admitting: Podiatry

## 2021-12-13 ENCOUNTER — Encounter: Payer: Self-pay | Admitting: Podiatry

## 2021-12-13 ENCOUNTER — Ambulatory Visit (INDEPENDENT_AMBULATORY_CARE_PROVIDER_SITE_OTHER): Payer: 59

## 2021-12-13 DIAGNOSIS — M7661 Achilles tendinitis, right leg: Secondary | ICD-10-CM

## 2021-12-13 DIAGNOSIS — M7662 Achilles tendinitis, left leg: Secondary | ICD-10-CM | POA: Diagnosis not present

## 2021-12-13 DIAGNOSIS — R609 Edema, unspecified: Secondary | ICD-10-CM

## 2021-12-13 DIAGNOSIS — G629 Polyneuropathy, unspecified: Secondary | ICD-10-CM

## 2021-12-13 MED ORDER — METHYLPREDNISOLONE 4 MG PO TBPK: ORAL_TABLET | ORAL | 0 refills | Status: DC

## 2021-12-13 NOTE — Patient Instructions (Signed)
Achilles Tendinitis  ?with Rehab ?Achilles tendinitis is a disorder of the Achilles tendon. The Achilles tendon connects the large calf muscles (Gastrocnemius and Soleus) to the heel bone (calcaneus). This tendon is sometimes called the heel cord. It is important for pushing-off and standing on your toes and is important for walking, running, or jumping. Tendinitis is often caused by overuse and repetitive microtrauma. ?SYMPTOMS ?Pain, tenderness, swelling, warmth, and redness may occur over the Achilles tendon even at rest. ?Pain with pushing off, or flexing or extending the ankle. ?Pain that is worsened after or during activity. ?CAUSES  ?Overuse sometimes seen with rapid increase in exercise programs or in sports requiring running and jumping. ?Poor physical conditioning (strength and flexibility or endurance). ?Running sports, especially training running down hills. ?Inadequate warm-up before practice or play or failure to stretch before participation. ?Injury to the tendon. ?PREVENTION  ?Warm up and stretch before practice or competition. ?Allow time for adequate rest and recovery between practices and competition. ?Keep up conditioning. ?Keep up ankle and leg flexibility. ?Improve or keep muscle strength and endurance. ?Improve cardiovascular fitness. ?Use proper technique. ?Use proper equipment (shoes, skates). ?To help prevent recurrence, taping, protective strapping, or an adhesive bandage may be recommended for several weeks after healing is complete. ?PROGNOSIS  ?Recovery may take weeks to several months to heal. ?Longer recovery is expected if symptoms have been prolonged. ?Recovery is usually quicker if the inflammation is due to a direct blow as compared with overuse or sudden strain. ?RELATED COMPLICATIONS  ?Healing time will be prolonged if the condition is not correctly treated. The injury must be given plenty of time to heal. ?Symptoms can reoccur if activity is resumed too soon. ?Untreated,  tendinitis may increase the risk of tendon rupture requiring additional time for recovery and possibly surgery. ?TREATMENT  ?The first treatment consists of rest anti-inflammatory medication, and ice to relieve the pain. ?Stretching and strengthening exercises after resolution of pain will likely help reduce the risk of recurrence. Referral to a physical therapist or athletic trainer for further evaluation and treatment may be helpful. ?A walking boot or cast may be recommended to rest the Achilles tendon. This can help break the cycle of inflammation and microtrauma. ?Arch supports (orthotics) may be prescribed or recommended by your caregiver as an adjunct to therapy and rest. ?Surgery to remove the inflamed tendon lining or degenerated tendon tissue is rarely necessary and has shown less than predictable results. ?MEDICATION  ?Nonsteroidal anti-inflammatory medications, such as aspirin and ibuprofen, may be used for pain and inflammation relief. Do not take within 7 days before surgery. Take these as directed by your caregiver. Contact your caregiver immediately if any bleeding, stomach upset, or signs of allergic reaction occur. Other minor pain relievers, such as acetaminophen, may also be used. ?Pain relievers may be prescribed as necessary by your caregiver. Do not take prescription pain medication for longer than 4 to 7 days. Use only as directed and only as much as you need. ?Cortisone injections are rarely indicated. Cortisone injections may weaken tendons and predispose to rupture. It is better to give the condition more time to heal than to use them. ?HEAT AND COLD ?Cold is used to relieve pain and reduce inflammation for acute and chronic Achilles tendinitis. Cold should be applied for 10 to 15 minutes every 2 to 3 hours for inflammation and pain and immediately after any activity that aggravates your symptoms. Use ice packs or an ice massage. ?Heat may be used before performing stretching   and  strengthening activities prescribed by your caregiver. Use a heat pack or a warm soak. ?SEEK MEDICAL CARE IF: ?Symptoms get worse or do not improve in 2 weeks despite treatment. ?New, unexplained symptoms develop. Drugs used in treatment may produce side effects. ? ?EXERCISES: ? ?RANGE OF MOTION (ROM) AND STRETCHING EXERCISES - Achilles Tendinitis  ?These exercises may help you when beginning to rehabilitate your injury. Your symptoms may resolve with or without further involvement from your physician, physical therapist or athletic trainer. While completing these exercises, remember:  ?Restoring tissue flexibility helps normal motion to return to the joints. This allows healthier, less painful movement and activity. ?An effective stretch should be held for at least 30 seconds. ?A stretch should never be painful. You should only feel a gentle lengthening or release in the stretched tissue. ? ?STRETCH  Gastroc, Standing  ?Place hands on wall. ?Extend right / left leg, keeping the front knee somewhat bent. ?Slightly point your toes inward on your back foot. ?Keeping your right / left heel on the floor and your knee straight, shift your weight toward the wall, not allowing your back to arch. ?You should feel a gentle stretch in the right / left calf. Hold this position for 10 seconds. ?Repeat 3 times. Complete this stretch 2 times per day. ? ?STRETCH  Soleus, Standing  ?Place hands on wall. ?Extend right / left leg, keeping the other knee somewhat bent. ?Slightly point your toes inward on your back foot. ?Keep your right / left heel on the floor, bend your back knee, and slightly shift your weight over the back leg so that you feel a gentle stretch deep in your back calf. ?Hold this position for 10 seconds. ?Repeat 3 times. Complete this stretch 2 times per day. ? ?STRETCH  Gastrocsoleus, Standing  ?Note: This exercise can place a lot of stress on your foot and ankle. Please complete this exercise only if specifically  instructed by your caregiver.  ?Place the ball of your right / left foot on a step, keeping your other foot firmly on the same step. ?Hold on to the wall or a rail for balance. ?Slowly lift your other foot, allowing your body weight to press your heel down over the edge of the step. ?You should feel a stretch in your right / left calf. ?Hold this position for 10 seconds. ?Repeat this exercise with a slight bend in your knee. ?Repeat 3 times. Complete this stretch 2 times per day.  ? ?STRENGTHENING EXERCISES - Achilles Tendinitis ?These exercises may help you when beginning to rehabilitate your injury. They may resolve your symptoms with or without further involvement from your physician, physical therapist or athletic trainer. While completing these exercises, remember:  ?Muscles can gain both the endurance and the strength needed for everyday activities through controlled exercises. ?Complete these exercises as instructed by your physician, physical therapist or athletic trainer. Progress the resistance and repetitions only as guided. ?You may experience muscle soreness or fatigue, but the pain or discomfort you are trying to eliminate should never worsen during these exercises. If this pain does worsen, stop and make certain you are following the directions exactly. If the pain is still present after adjustments, discontinue the exercise until you can discuss the trouble with your clinician. ? ?STRENGTH - Plantar-flexors  ?Sit with your right / left leg extended. Holding onto both ends of a rubber exercise band/tubing, loop it around the ball of your foot. Keep a slight tension in the band. ?Slowly   push your toes away from you, pointing them downward. ?Hold this position for 10 seconds. Return slowly, controlling the tension in the band/tubing. ?Repeat 3 times. Complete this exercise 2 times per day.  ? ?STRENGTH - Plantar-flexors  ?Stand with your feet shoulder width apart. Steady yourself with a wall or table  using as little support as needed. ?Keeping your weight evenly spread over the width of your feet, rise up on your toes.* ?Hold this position for 10 seconds. ?Repeat 3 times. Complete this exercise 2 times per day.  ?

## 2021-12-13 NOTE — Progress Notes (Signed)
?  Subjective:  ?Patient ID: Ellen Simmons, female    DOB: [birth year],   MRN: [id number] ? ?Chief Complaint  ?Patient presents with  ? Foot Swelling  ?   ? diabetic with swelling on back of both feet  ? ? ?61 y.o. female presents for concern for bilateral swelling and pain in the back of her heels. Relates she gets shooting  pains up her legs as well but mostly painful when standing up after resting. Unable to take NSAIDs. Denies any other treatments. She is diabetic and last A1c was 6.3  . Denies any other pedal complaints. Denies n/v/f/c.  ? ?Past Medical History:  ?Diagnosis Date  ? Hemorrhoids   ? Hypertension   ? ? ?Objective:  ?Physical Exam: ?Vascular: DP/PT pulses 2/4 bilateral. CFT <3 seconds. Normal hair growth on digits. No edema.  ?Skin. No lacerations or abrasions bilateral feet.  ?Musculoskeletal: MMT 5/5 bilateral lower extremities in DF, PF, Inversion and Eversion. Deceased ROM in DF of ankle joint. Tender to insertion of achilles tendon bilateral. Pain with DF. Also has tenderness everywhere else on the foot but relates it is more burning and tingling.  ?Neurological: Sensation intact to light touch.  ? ?Assessment:  ? ?1. Achilles tendonitis, bilateral   ?2. Neuropathy   ? ? ? ?Plan:  ?Patient was evaluated and treated and all questions answered. ?-Xrays reviewed. Spurring noted to bilateral posterior calcaneus.  ?-Discussed Achilles insertional tendonitis and treatment options with patient.  ?-Discussed stretching exercises. ?-Rx Medrol dose pack. provided  ?-Heel lifts provided and discussed proper shoewear.  ?-Discussed if no improvement will consider MRI/PT/EPAT/PRP injections.  ?-Patient to return 6 weeks for re-evaluation.  ? ? ?Louann Sjogren, DPM  ? ? ?

## 2021-12-13 NOTE — Addendum Note (Signed)
Addended by: Francesca Oman on: 12/13/2021 10:40 AM ? ? Modules accepted: Orders ? ?

## 2022-01-24 ENCOUNTER — Ambulatory Visit (INDEPENDENT_AMBULATORY_CARE_PROVIDER_SITE_OTHER): Payer: 59 | Admitting: Podiatry

## 2022-01-24 ENCOUNTER — Encounter: Payer: Self-pay | Admitting: Podiatry

## 2022-01-24 DIAGNOSIS — M7661 Achilles tendinitis, right leg: Secondary | ICD-10-CM

## 2022-01-24 DIAGNOSIS — M7662 Achilles tendinitis, left leg: Secondary | ICD-10-CM

## 2022-01-24 NOTE — Progress Notes (Signed)
  Subjective:  Patient ID: Ellen Simmons, female    DOB: 1960/10/09,   MRN: [id number]  Chief Complaint  Patient presents with   Follow-up    6 weeks (around 01/24/2022) for b.l achilles    61 y.o. female presents for follow-up of bilateral achilles tendonitis. Relates the pain has worsened on the left and not any better on right. Relates it hurts when sitting or taking a step. States medrol dose pack did not help and heel lifts squished down and stopped helping.  She is diabetic and last A1c was 6.3  . Denies any other pedal complaints. Denies n/v/f/c.   Past Medical History:  Diagnosis Date   Hemorrhoids    Hypertension     Objective:  Physical Exam: Vascular: DP/PT pulses 2/4 bilateral. CFT <3 seconds. Normal hair growth on digits. No edema.  Skin. No lacerations or abrasions bilateral feet.  Musculoskeletal: MMT 5/5 bilateral lower extremities in DF, PF, Inversion and Eversion. Deceased ROM in DF of ankle joint. Tender to insertion of achilles tendon bilateral. Left worse than right today. Pain with DF. Also has tenderness everywhere else on the foot but relates it is more burning and tingling.  Neurological: Sensation intact to light touch.   Assessment:   1. Achilles tendonitis, bilateral      Plan:  Patient was evaluated and treated and all questions answered. -Xrays reviewed. Spurring noted to bilateral posterior calcaneus.  -Discussed Achilles insertional tendonitis and treatment options with patient.  -Referral to PT placed.  -Discussed topical voltaren gel.  -CAM boot dispensed.  -Discussed if no improvement will consider MRI/EPAT/PRP injections.  -Patient to return 6 weeks for re-evaluation.    Lorenda Peck, DPM

## 2022-01-30 NOTE — Therapy (Addendum)
OUTPATIENT PHYSICAL THERAPY EVALUATION  DISCHARGE   Patient Name: Ellen Simmons MRN: [id number] DOB:26-Apr-1961, 61 y.o., female Today's Date: 01/31/2022   PT End of Session - 01/31/22 1714     Visit Number 1    Number of Visits 13    Date for PT Re-Evaluation 03/14/22    Authorization Type [employer]    PT Start Time 1700    PT Stop Time 1745    PT Time Calculation (min) 45 min    Activity Tolerance Patient tolerated treatment well    Behavior During Therapy WFL for tasks assessed/performed             Past Medical History:  Diagnosis Date   Hemorrhoids    Hypertension    History reviewed. No pertinent surgical history. Patient Active Problem List   Diagnosis Date Noted   Acute respiratory failure with hypoxia ([place]) 07/04/2021   Hypertensive emergency    Substance abuse ([place])    AKI (acute kidney injury) ([place])     PCP: Placey, Audrea Muscat, NP  REFERRING PROVIDER: Lorenda Peck, DPM  REFERRING DIAG: Achilles tendonitis, bilateral  THERAPY DIAG:  Pain in left ankle and joints of left foot  Pain in right ankle and joints of right foot  Other abnormalities of gait and mobility  Muscle weakness (generalized)  Rationale for Evaluation and Treatment Rehabilitation  ONSET DATE: ongoing approximately 3 months   SUBJECTIVE:  SUBJECTIVE STATEMENT: Patient reports achilles pain that has been ongoing for about 3 months. Pain gradually came on with no apparent mechanism. She is currently in a CAM boot and can come out of it after 4 weeks, she has been in it for 1 week so far. She reports both achilles hurt, but the left is worse so she is using the boot on the left side. She notes the left side feels numb now.   PERTINENT HISTORY: None  PAIN:  Are you having pain? Yes:  NPRS scale: 8/10 on left, 4/5 on right Pain location: Bilateral achilles (left worse than right) Pain description: Constant, burning, throbbing, aching Aggravating factors:  Standing, walking Relieving factors: Ice/heat, Tylenol, rest  PRECAUTIONS: None  WEIGHT BEARING RESTRICTIONS No  FALLS:  Has patient fallen in last 6 months? No  LIVING ENVIRONMENT: Lives with:  6 other people Lives in: House/apartment Stairs: Yes: External: 3 steps; on left going up  OCCUPATION: Works at [employer], is on feet most of the day  PLOF: Independent  PATIENT GOALS: Pain relief with standing and walking   OBJECTIVE:  PATIENT SURVEYS:  FOTO 36% functional status  COGNITION: Overall cognitive status: Within functional limits for tasks assessed     SENSATION: WFL  MUSCLE LENGTH: Bilateral calf flexibility deficit  POSTURE: rounded shoulders, mildly pes planus  PALPATION: Tender from achilles insertion on heel to midportion achilles  LOWER EXTREMITY ROM:  Active ROM Right eval Left eval  Ankle dorsiflexion 3 -2  Ankle plantarflexion 50 40  Ankle inversion 35 20  Ankle eversion 10 8   (Blank rows = not tested)  LOWER EXTREMITY MMT:  MMT Right eval Left eval  Hip flexion 4- 4-  Hip extension 3 3  Hip abduction 3 3  Ankle dorsiflexion 4 4  Ankle plantarflexion 2 2  Ankle inversion 4 4  Ankle eversion 4 4   (Blank rows = not tested)  FUNCTIONAL TESTS:  SLS: unable  GAIT: Assistive device utilized: None Level of assistance: Complete [place] Comments: CAM boot on left, antalgic gait L >  R   TODAY'S TREATMENT: Longsitting PF with blue 2 x 10 each Longsitting calf stretch with strap 2 x 30 sec each Seated heel toe raise x 20   PATIENT EDUCATION:  Education details: Exam findings, POC, HEP Person educated: Patient Education method: Explanation, Demonstration, Tactile cues, Verbal cues, and Handouts Education comprehension: verbalized understanding, returned demonstration, verbal cues required, tactile cues required, and needs further education  HOME EXERCISE PROGRAM: Access Code: [email or web address]   ASSESSMENT: CLINICAL  IMPRESSION: Patient is a 61 y.o. female who was seen today for physical therapy evaluation and treatment for bilateral ankle pain consistent with achilles tendinopathy. She demonstrates limitations in ankle DF motion and gross calf strength deficits, as well as balance impairment likely leading to poor tolerance for standing and walking and gait deviations.   OBJECTIVE IMPAIRMENTS Abnormal gait, decreased activity tolerance, decreased balance, difficulty walking, decreased ROM, decreased strength, impaired flexibility, and pain.   ACTIVITY LIMITATIONS standing and locomotion level  PARTICIPATION LIMITATIONS: meal prep, cleaning, shopping, community activity, and occupation  [place], Past/current experiences, Profession, and Time since onset of injury/illness/exacerbation are also affecting patient's functional outcome.   REHAB POTENTIAL: Good  CLINICAL DECISION MAKING: Stable/uncomplicated  EVALUATION COMPLEXITY: Low   GOALS: Goals reviewed with patient? Yes  SHORT TERM GOALS: Target date: 02/21/2022  Patient will be I with initial HEP in order to progress with therapy. Baseline: HEP provided at eval Goal status: INITIAL  2.  PT will review FOTO with patient by 3rd visit in order to understand expected progress and outcome with therapy. Baseline: FOTO assessed at eval Goal status: INITIAL  3.  Patient will report pain level </= 5/10 with in order to reduce functional limitations Baseline: 8/10 pain level Goal status: INITIAL  LONG TERM GOALS: Target date: 03/14/2022  Patient will be I with final HEP to maintain progress from PT. Baseline: HEP provided at eval Goal status: INITIAL  2.  Patient will report >/= 61% status on FOTO to indicate improved functional ability. Baseline: 36% Goal status: INITIAL  3.  Patient will demonstrate >/= 4/5 MMT for bilat PF strength to improve standing and walking tolerance. Baseline: grossly 2/5 MMT bilaterally Goal  status: INITIAL  4.  Patient will exhibit ankle DF AROM >/= 8 deg in order to all for normalized gait and decrease pain. Baseline: patient exhibits limitation in ankle DF Goal status: INITIAL   PLAN: PT FREQUENCY: 1-2x/week  PT DURATION: 6 weeks  PLANNED INTERVENTIONS: Therapeutic exercises, Therapeutic activity, Neuromuscular re-education, Balance training, Gait training, Patient/Family education, Joint manipulation, Joint mobilization, Aquatic Therapy, Dry Needling, Electrical stimulation, Spinal manipulation, Spinal mobilization, Cryotherapy, Moist heat, Taping, Ultrasound, Ionotophoresis 9m/ml Dexamethasone, Manual therapy, and Re-evaluation  PLAN FOR NEXT SESSION: Review HEP and progress PRN, manual and or modalities for calf and achilles to reduce pain and improve motion, trial standing PF isometrics, progress calf strengthening   CHilda Blades PT, DPT, LAT, ATC 02/01/22  8:24 AM Phone: [phone]: [phone] PHYSICAL THERAPY DISCHARGE SUMMARY  Visits from Start of Care: 1  Current functional level related to goals / functional outcomes: See above   Remaining deficits: See above   Education / Equipment: HEP   Patient agrees to discharge. Patient goals were not met. Patient is being discharged due to not returning since the last visit.

## 2022-01-31 ENCOUNTER — Encounter: Payer: Self-pay | Admitting: Physical Therapy

## 2022-01-31 ENCOUNTER — Other Ambulatory Visit: Payer: Self-pay

## 2022-01-31 ENCOUNTER — Ambulatory Visit: Payer: 59 | Attending: Podiatry | Admitting: Physical Therapy

## 2022-01-31 DIAGNOSIS — M6281 Muscle weakness (generalized): Secondary | ICD-10-CM | POA: Diagnosis present

## 2022-01-31 DIAGNOSIS — R2689 Other abnormalities of gait and mobility: Secondary | ICD-10-CM | POA: Diagnosis present

## 2022-01-31 DIAGNOSIS — M25571 Pain in right ankle and joints of right foot: Secondary | ICD-10-CM | POA: Insufficient documentation

## 2022-01-31 DIAGNOSIS — M7662 Achilles tendinitis, left leg: Secondary | ICD-10-CM | POA: Insufficient documentation

## 2022-01-31 DIAGNOSIS — M25572 Pain in left ankle and joints of left foot: Secondary | ICD-10-CM | POA: Diagnosis present

## 2022-01-31 DIAGNOSIS — M7661 Achilles tendinitis, right leg: Secondary | ICD-10-CM | POA: Diagnosis not present

## 2022-01-31 NOTE — Patient Instructions (Signed)
Access Code: [email or web address] URL: [email or web address] Date: 01/31/2022 Prepared by: Hilda Blades  Exercises - Long Sitting Ankle Plantar Flexion with Resistance  - 2 x daily - 3 sets - 10 reps - Long Sitting Calf Stretch with Strap  - 2 x daily - 3 reps - 30 seconds hold - Seated Heel Toe Raises  - 2 x daily - 2 sets - 20 reps

## 2022-02-06 ENCOUNTER — Ambulatory Visit: Payer: 59 | Admitting: Physical Therapy

## 2022-02-07 ENCOUNTER — Ambulatory Visit: Payer: 59 | Admitting: Physical Therapy

## 2022-02-12 ENCOUNTER — Ambulatory Visit: Payer: 59 | Admitting: Physical Therapy

## 2022-02-14 ENCOUNTER — Ambulatory Visit: Payer: 59 | Admitting: Physical Therapy

## 2022-02-19 ENCOUNTER — Ambulatory Visit: Payer: 59 | Admitting: Physical Therapy

## 2022-02-22 ENCOUNTER — Ambulatory Visit: Payer: 59 | Admitting: Physical Therapy

## 2022-02-26 ENCOUNTER — Encounter: Payer: 59 | Admitting: Physical Therapy

## 2022-02-28 ENCOUNTER — Encounter: Payer: 59 | Admitting: Physical Therapy

## 2022-03-28 ENCOUNTER — Ambulatory Visit (INDEPENDENT_AMBULATORY_CARE_PROVIDER_SITE_OTHER): Payer: Self-pay | Admitting: Podiatry

## 2022-03-28 DIAGNOSIS — Z91199 Patient's noncompliance with other medical treatment and regimen due to unspecified reason: Secondary | ICD-10-CM

## 2022-03-28 NOTE — Progress Notes (Signed)
No show

## 2023-06-29 IMAGING — CT CT HEAD W/O CM
3 of 4 series · 15 of 47 positions shown, 17 images · non-contrast
Comparison: None.

CLINICAL DATA: Headache, uncontrolled hypertension, cocaine use

EXAM:
CT HEAD WITHOUT CONTRAST
TECHNIQUE: Contiguous axial images were obtained from the base of the skull
through the vertex without intravenous contrast.

[Series 2: head wo · axial · 0.47mm/px · z∈[+1246,+1366]mm · 7 of 32 slices shown, 9 images]
[im 4/32  brain]
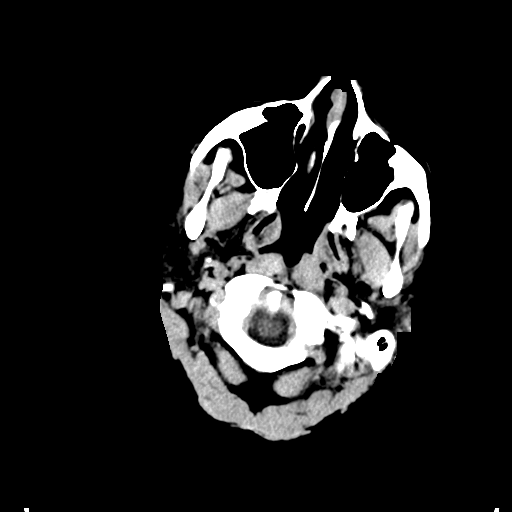
[im 4/32  bone]
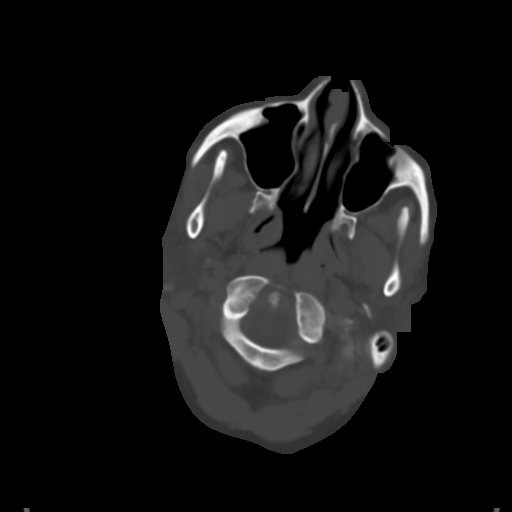
[im 8/32  brain]
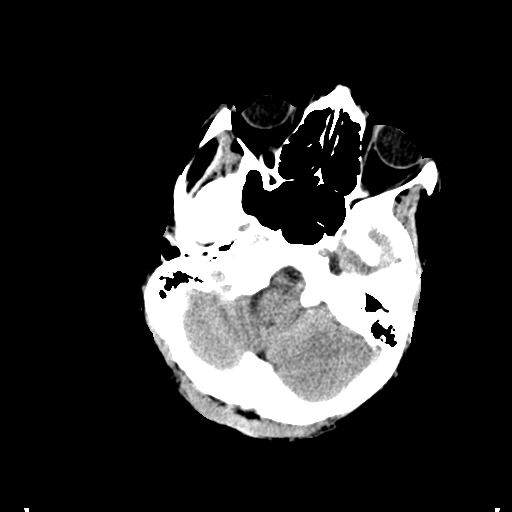
[im 12/32  brain]
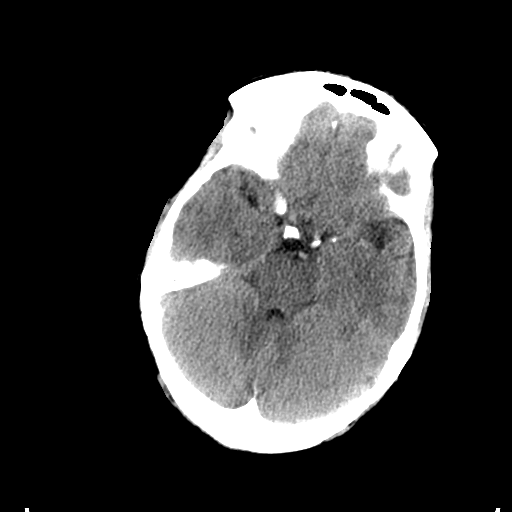
[im 16/32  brain]
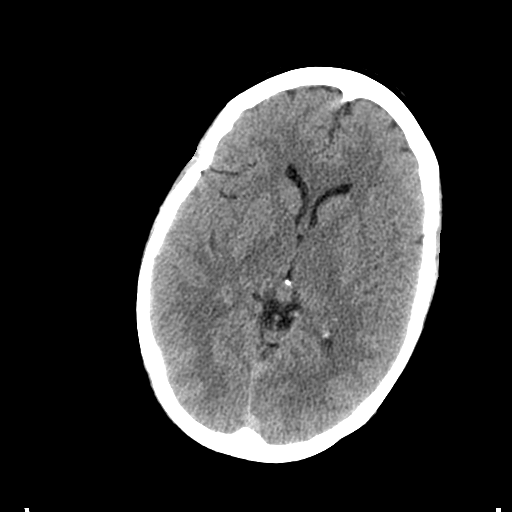
[im 20/32  brain]
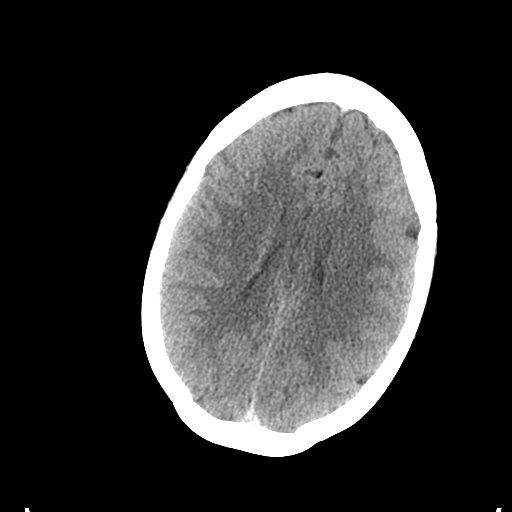
[im 20/32  bone]
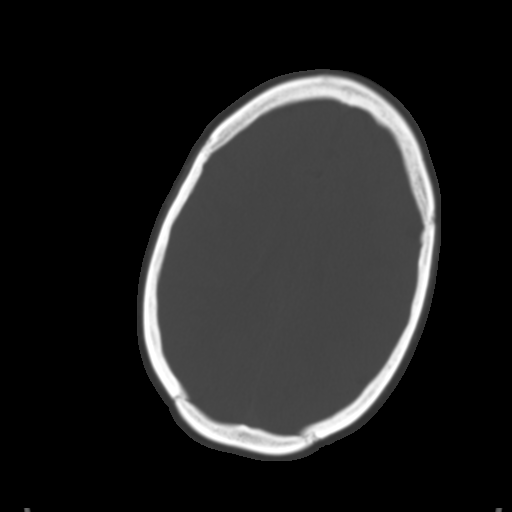
[im 24/32  brain]
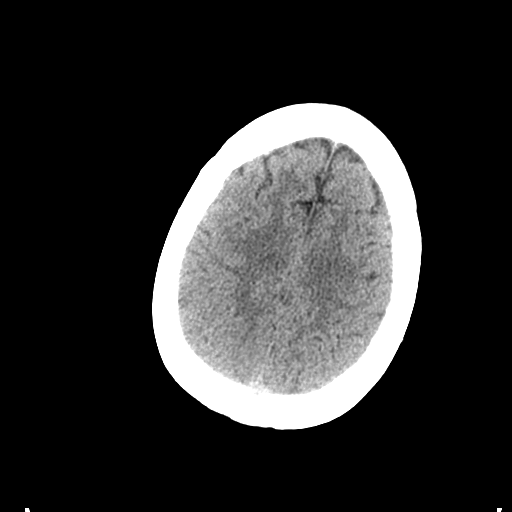
[im 28/32  brain]
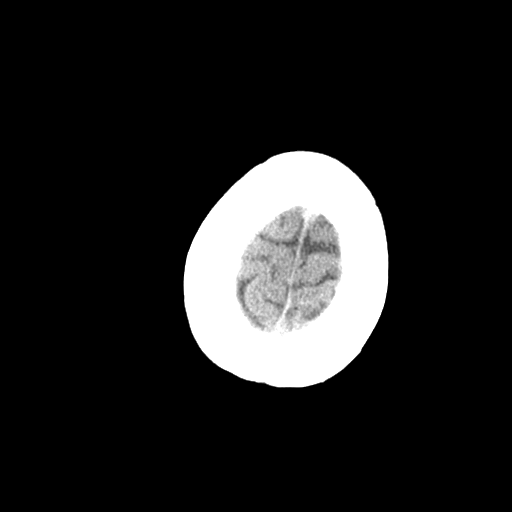

[Series 3: head bone · axial · 0.47mm/px · z∈[+1245,+1315]mm · 5 of 78 slices shown]
[im 8/78  bone]
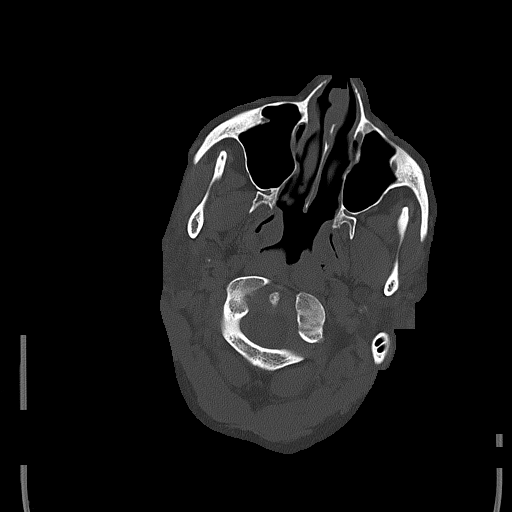
[im 16/78  bone]
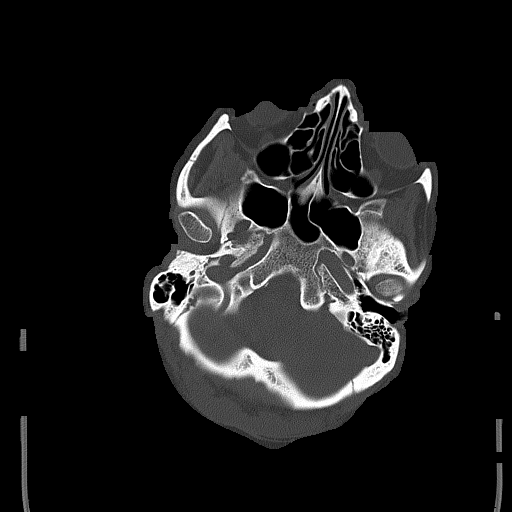
[im 24/78  bone]
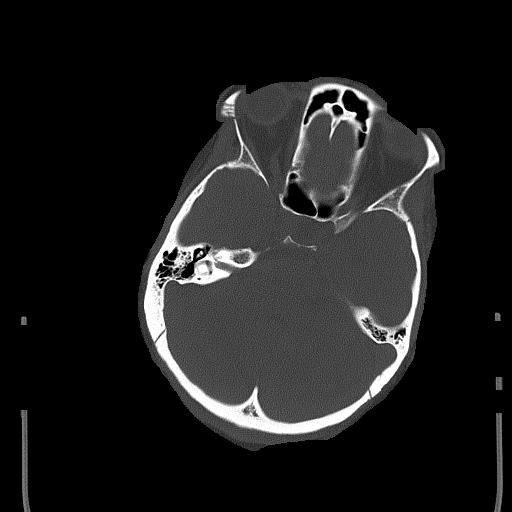
[im 35/78  bone]
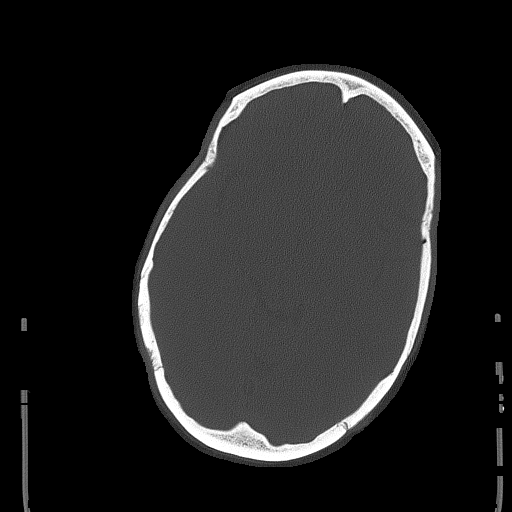
[im 43/78  bone]
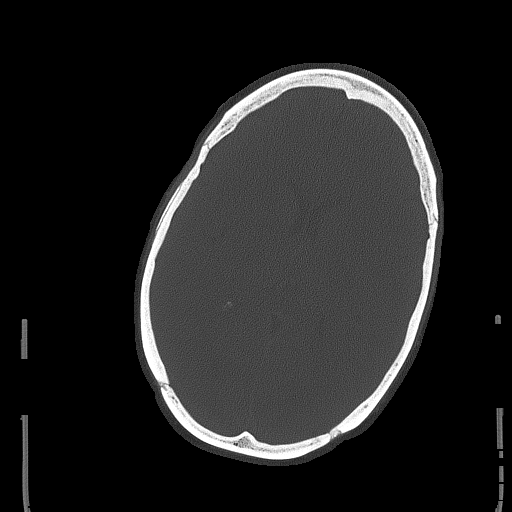

[Series 5: coronal soft tissue · coronal · 0.30mm/px · 3 of 76 slices shown]
[im 26/76  brain]
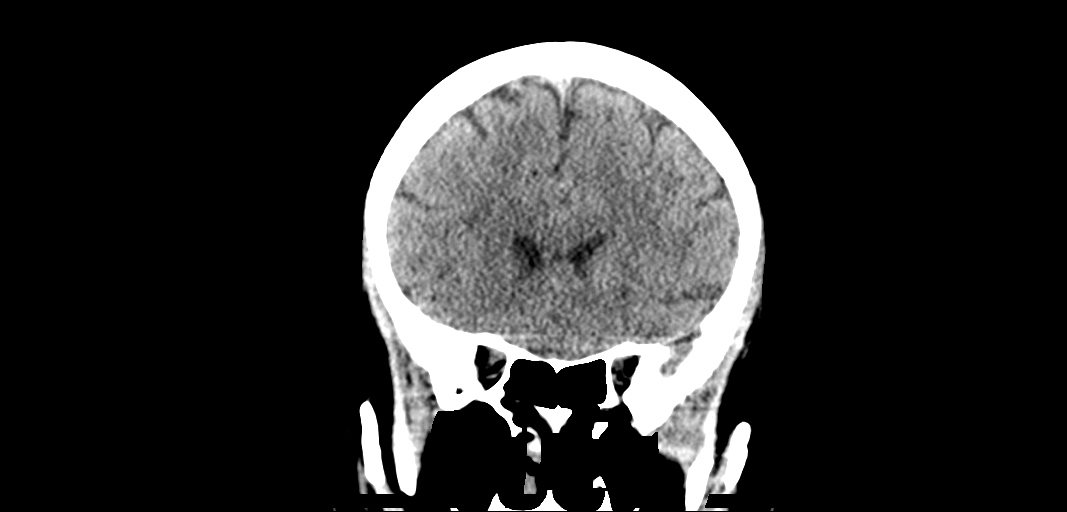
[im 34/76  brain]
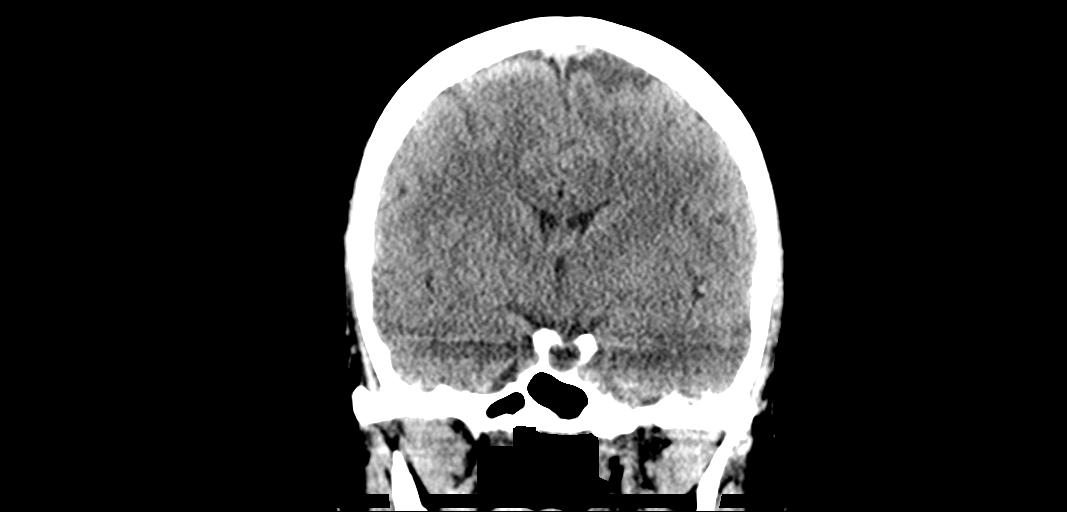
[im 42/76  brain]
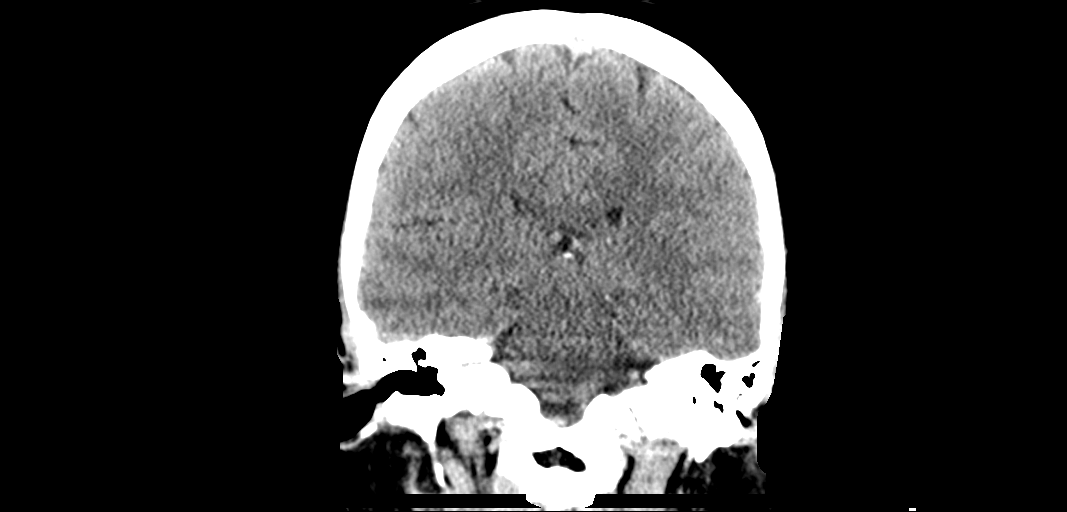

[15 of 47 positions shown; findings below may reference images not displayed]

FINDINGS: Brain: No evidence of acute infarction, hemorrhage, hydrocephalus,
extra-axial collection or mass lesion/mass effect.

Vascular: No hyperdense vessel or unexpected calcification.

Skull: Normal. Negative for fracture or focal lesion.

Sinuses/Orbits: No acute finding.

Other: None.
IMPRESSION: No acute intracranial pathology. No non-contrast CT findings to
explain headache.

## 2024-08-05 ENCOUNTER — Inpatient Hospital Stay (HOSPITAL_COMMUNITY)
Admission: EM | Admit: 2024-08-05 | Discharge: 2024-08-08 | DRG: 202 | Disposition: A | Discharge status: Home / Self Care | Attending: Internal Medicine | Admitting: Internal Medicine

## 2024-08-05 ENCOUNTER — Other Ambulatory Visit: Payer: Self-pay

## 2024-08-05 ENCOUNTER — Emergency Department (HOSPITAL_COMMUNITY)

## 2024-08-05 ENCOUNTER — Encounter (HOSPITAL_COMMUNITY): Payer: Self-pay

## 2024-08-05 DIAGNOSIS — F1910 Other psychoactive substance abuse, uncomplicated: Secondary | ICD-10-CM | POA: Diagnosis present

## 2024-08-05 DIAGNOSIS — Z7985 Long-term (current) use of injectable non-insulin antidiabetic drugs: Secondary | ICD-10-CM

## 2024-08-05 DIAGNOSIS — E119 Type 2 diabetes mellitus without complications: Secondary | ICD-10-CM | POA: Diagnosis not present

## 2024-08-05 DIAGNOSIS — J209 Acute bronchitis, unspecified: Secondary | ICD-10-CM | POA: Diagnosis not present

## 2024-08-05 DIAGNOSIS — I160 Hypertensive urgency: Secondary | ICD-10-CM | POA: Diagnosis not present

## 2024-08-05 DIAGNOSIS — F17210 Nicotine dependence, cigarettes, uncomplicated: Secondary | ICD-10-CM | POA: Diagnosis present

## 2024-08-05 DIAGNOSIS — Z1152 Encounter for screening for COVID-19: Secondary | ICD-10-CM

## 2024-08-05 DIAGNOSIS — T380X5A Adverse effect of glucocorticoids and synthetic analogues, initial encounter: Secondary | ICD-10-CM | POA: Diagnosis present

## 2024-08-05 DIAGNOSIS — J40 Bronchitis, not specified as acute or chronic: Principal | ICD-10-CM

## 2024-08-05 DIAGNOSIS — J204 Acute bronchitis due to parainfluenza virus: Principal | ICD-10-CM | POA: Diagnosis present

## 2024-08-05 DIAGNOSIS — J441 Chronic obstructive pulmonary disease with (acute) exacerbation: Secondary | ICD-10-CM | POA: Diagnosis present

## 2024-08-05 DIAGNOSIS — Z6839 Body mass index (BMI) 39.0-39.9, adult: Secondary | ICD-10-CM

## 2024-08-05 DIAGNOSIS — F17200 Nicotine dependence, unspecified, uncomplicated: Secondary | ICD-10-CM | POA: Diagnosis present

## 2024-08-05 DIAGNOSIS — E66812 Obesity, class 2: Secondary | ICD-10-CM | POA: Diagnosis present

## 2024-08-05 DIAGNOSIS — Z79899 Other long term (current) drug therapy: Secondary | ICD-10-CM

## 2024-08-05 DIAGNOSIS — Z7984 Long term (current) use of oral hypoglycemic drugs: Secondary | ICD-10-CM

## 2024-08-05 DIAGNOSIS — K649 Unspecified hemorrhoids: Secondary | ICD-10-CM | POA: Diagnosis present

## 2024-08-05 DIAGNOSIS — I110 Hypertensive heart disease with heart failure: Secondary | ICD-10-CM | POA: Diagnosis present

## 2024-08-05 DIAGNOSIS — I5022 Chronic systolic (congestive) heart failure: Secondary | ICD-10-CM | POA: Diagnosis not present

## 2024-08-05 DIAGNOSIS — E878 Other disorders of electrolyte and fluid balance, not elsewhere classified: Secondary | ICD-10-CM | POA: Insufficient documentation

## 2024-08-05 DIAGNOSIS — F1411 Cocaine abuse, in remission: Secondary | ICD-10-CM | POA: Diagnosis present

## 2024-08-05 DIAGNOSIS — Z794 Long term (current) use of insulin: Secondary | ICD-10-CM

## 2024-08-05 DIAGNOSIS — E6609 Other obesity due to excess calories: Secondary | ICD-10-CM | POA: Diagnosis present

## 2024-08-05 LAB — CBC
HCT: 44.7 % (ref 36.0–46.0)
Hemoglobin: 14.7 g/dL (ref 12.0–15.0)
MCH: 27.1 pg (ref 26.0–34.0)
MCHC: 32.9 g/dL (ref 30.0–36.0)
MCV: 82.3 fL (ref 80.0–100.0)
Platelets: 240 K/uL (ref 150–400)
RBC: 5.43 MIL/uL — ABNORMAL HIGH (ref 3.87–5.11)
RDW: 14.2 % (ref 11.5–15.5)
WBC: 9.6 K/uL (ref 4.0–10.5)
nRBC: 0 % (ref 0.0–0.2)

## 2024-08-05 LAB — CBG MONITORING, ED: Glucose-Capillary: 299 mg/dL — ABNORMAL HIGH (ref 70–99)

## 2024-08-05 LAB — URINE DRUG SCREEN
Amphetamines: NEGATIVE
Barbiturates: NEGATIVE
Benzodiazepines: NEGATIVE
Cocaine: NEGATIVE
Fentanyl: NEGATIVE
Methadone Scn, Ur: NEGATIVE
Opiates: NEGATIVE
Tetrahydrocannabinol: NEGATIVE

## 2024-08-05 LAB — BASIC METABOLIC PANEL WITH GFR
Anion gap: 13 (ref 5–15)
BUN: 12 mg/dL (ref 8–23)
CO2: 25 mmol/L (ref 22–32)
Calcium: 9.7 mg/dL (ref 8.9–10.3)
Chloride: 101 mmol/L (ref 98–111)
Creatinine, Ser: 0.59 mg/dL (ref 0.44–1.00)
GFR, Estimated: >60 mL/min (ref >60)
Glucose, Bld: 152 mg/dL — ABNORMAL HIGH (ref 70–99)
Potassium: 3.8 mmol/L (ref 3.5–5.1)
Sodium: 138 mmol/L (ref 135–145)

## 2024-08-05 LAB — RESP PANEL BY RT-PCR (RSV, FLU A&B, COVID)  RVPGX2
Influenza A by PCR: NEGATIVE
Influenza B by PCR: NEGATIVE
Resp Syncytial Virus by PCR: NEGATIVE
SARS Coronavirus 2 by RT PCR: NEGATIVE

## 2024-08-05 LAB — GROUP A STREP BY PCR: Group A Strep by PCR: NOT DETECTED

## 2024-08-05 MED ORDER — POLYETHYLENE GLYCOL 3350 17 G PO PACK
17.0000 g | PACK | Freq: Every day | ORAL | Status: DC | PRN
  Administered 2024-08-08: 17 g via ORAL
  Filled 2024-08-05: qty 1

## 2024-08-05 MED ORDER — ALBUTEROL SULFATE (2.5 MG/3ML) 0.083% IN NEBU
5.0000 mg | INHALATION_SOLUTION | Freq: Once | RESPIRATORY_TRACT | Status: AC
  Administered 2024-08-05: 20:00:00 5 mg via RESPIRATORY_TRACT
  Filled 2024-08-05: qty 6

## 2024-08-05 MED ORDER — ENOXAPARIN SODIUM 40 MG/0.4ML IJ SOSY
40.0000 mg | PREFILLED_SYRINGE | INTRAMUSCULAR | Status: DC
  Administered 2024-08-06: 10:00:00 40 mg via SUBCUTANEOUS
  Filled 2024-08-05: qty 0.4

## 2024-08-05 MED ORDER — IPRATROPIUM BROMIDE 0.02 % IN SOLN
0.5000 mg | Freq: Once | RESPIRATORY_TRACT | Status: AC
  Administered 2024-08-05: 21:00:00 0.5 mg via RESPIRATORY_TRACT
  Filled 2024-08-05: qty 2.5

## 2024-08-05 MED ORDER — ACETAMINOPHEN 650 MG RE SUPP: 650.0000 mg | Freq: Four times a day (QID) | RECTAL | Status: DC | PRN

## 2024-08-05 MED ORDER — CYCLOBENZAPRINE HCL 10 MG PO TABS
10.0000 mg | ORAL_TABLET | Freq: Three times a day (TID) | ORAL | Status: DC | PRN
  Administered 2024-08-06 – 2024-08-07 (×2): 10 mg via ORAL
  Filled 2024-08-05 (×2): qty 1

## 2024-08-05 MED ORDER — IPRATROPIUM BROMIDE 0.02 % IN SOLN
0.5000 mg | Freq: Once | RESPIRATORY_TRACT | Status: AC
  Administered 2024-08-05: 18:00:00 0.5 mg via RESPIRATORY_TRACT
  Filled 2024-08-05: qty 2.5

## 2024-08-05 MED ORDER — ONDANSETRON HCL 4 MG PO TABS: 4.0000 mg | ORAL_TABLET | Freq: Four times a day (QID) | ORAL | Status: DC | PRN

## 2024-08-05 MED ORDER — IPRATROPIUM-ALBUTEROL 0.5-2.5 (3) MG/3ML IN SOLN
3.0000 mL | RESPIRATORY_TRACT | Status: DC | PRN
  Administered 2024-08-06 – 2024-08-08 (×2): 3 mL via RESPIRATORY_TRACT
  Filled 2024-08-05: qty 3

## 2024-08-05 MED ORDER — METHYLPREDNISOLONE SODIUM SUCC 125 MG IJ SOLR
125.0000 mg | Freq: Once | INTRAMUSCULAR | Status: AC
  Administered 2024-08-05: 20:00:00 125 mg via INTRAVENOUS
  Filled 2024-08-05: qty 2

## 2024-08-05 MED ORDER — PREDNISONE 50 MG PO TABS
50.0000 mg | ORAL_TABLET | Freq: Every day | ORAL | Status: DC
  Administered 2024-08-06 – 2024-08-08 (×3): 50 mg via ORAL
  Filled 2024-08-05 (×3): qty 1

## 2024-08-05 MED ORDER — MAGNESIUM SULFATE 2 GM/50ML IV SOLN
2.0000 g | Freq: Once | INTRAVENOUS | Status: AC
  Administered 2024-08-05: 23:00:00 2 g via INTRAVENOUS
  Filled 2024-08-05: qty 50

## 2024-08-05 MED ORDER — PANTOPRAZOLE SODIUM 40 MG PO TBEC
40.0000 mg | DELAYED_RELEASE_TABLET | Freq: Every day | ORAL | Status: DC
  Administered 2024-08-06 – 2024-08-08 (×3): 40 mg via ORAL
  Filled 2024-08-05 (×3): qty 1

## 2024-08-05 MED ORDER — ALBUTEROL SULFATE (2.5 MG/3ML) 0.083% IN NEBU
5.0000 mg | INHALATION_SOLUTION | Freq: Once | RESPIRATORY_TRACT | Status: AC
  Administered 2024-08-05: 21:00:00 5 mg via RESPIRATORY_TRACT
  Filled 2024-08-05: qty 6

## 2024-08-05 MED ORDER — AMLODIPINE BESYLATE 5 MG PO TABS
5.0000 mg | ORAL_TABLET | Freq: Every day | ORAL | Status: DC
  Administered 2024-08-06: 10:00:00 5 mg via ORAL
  Filled 2024-08-05: qty 1

## 2024-08-05 MED ORDER — BENZONATATE 100 MG PO CAPS
100.0000 mg | ORAL_CAPSULE | Freq: Once | ORAL | Status: AC
  Administered 2024-08-05: 18:00:00 100 mg via ORAL
  Filled 2024-08-05: qty 1

## 2024-08-05 MED ORDER — KETOROLAC TROMETHAMINE 15 MG/ML IJ SOLN
15.0000 mg | Freq: Four times a day (QID) | INTRAMUSCULAR | Status: DC | PRN
  Administered 2024-08-05: 23:00:00 15 mg via INTRAVENOUS
  Filled 2024-08-05: qty 1

## 2024-08-05 MED ORDER — LOSARTAN POTASSIUM 50 MG PO TABS
100.0000 mg | ORAL_TABLET | Freq: Every day | ORAL | Status: DC
  Administered 2024-08-06 – 2024-08-08 (×3): 100 mg via ORAL
  Filled 2024-08-05 (×3): qty 2

## 2024-08-05 MED ORDER — INSULIN ASPART 100 UNIT/ML IJ SOLN
0.0000 [IU] | Freq: Three times a day (TID) | INTRAMUSCULAR | Status: DC
  Administered 2024-08-06: 13:00:00 3 [IU] via SUBCUTANEOUS
  Administered 2024-08-06 (×2): 4 [IU] via SUBCUTANEOUS
  Filled 2024-08-05 (×2): qty 3
  Filled 2024-08-05: qty 4

## 2024-08-05 MED ORDER — IPRATROPIUM BROMIDE 0.02 % IN SOLN
0.5000 mg | Freq: Once | RESPIRATORY_TRACT | Status: AC
  Administered 2024-08-05: 20:00:00 0.5 mg via RESPIRATORY_TRACT
  Filled 2024-08-05: qty 2.5

## 2024-08-05 MED ORDER — HYDRALAZINE HCL 25 MG PO TABS
25.0000 mg | ORAL_TABLET | Freq: Three times a day (TID) | ORAL | Status: DC | PRN
  Filled 2024-08-05: qty 1

## 2024-08-05 MED ORDER — SODIUM CHLORIDE 0.9% FLUSH
3.0000 mL | Freq: Two times a day (BID) | INTRAVENOUS | Status: DC
  Administered 2024-08-06 – 2024-08-08 (×5): 3 mL via INTRAVENOUS

## 2024-08-05 MED ORDER — HYDROXYZINE HCL 25 MG PO TABS
25.0000 mg | ORAL_TABLET | Freq: Three times a day (TID) | ORAL | Status: DC | PRN
  Administered 2024-08-07: 25 mg via ORAL
  Filled 2024-08-05: qty 1

## 2024-08-05 MED ORDER — ACETAMINOPHEN 325 MG PO TABS
650.0000 mg | ORAL_TABLET | Freq: Four times a day (QID) | ORAL | Status: DC | PRN
  Administered 2024-08-06: 15:00:00 650 mg via ORAL
  Filled 2024-08-05: qty 2

## 2024-08-05 MED ORDER — ALBUTEROL SULFATE (2.5 MG/3ML) 0.083% IN NEBU
5.0000 mg | INHALATION_SOLUTION | Freq: Once | RESPIRATORY_TRACT | Status: AC
  Administered 2024-08-05: 18:00:00 5 mg via RESPIRATORY_TRACT
  Filled 2024-08-05: qty 6

## 2024-08-05 MED ORDER — GUAIFENESIN 100 MG/5ML PO LIQD
5.0000 mL | ORAL | Status: DC | PRN
  Administered 2024-08-06: 01:00:00 5 mL via ORAL
  Filled 2024-08-05: qty 10

## 2024-08-05 MED ORDER — HYDROCHLOROTHIAZIDE 25 MG PO TABS
50.0000 mg | ORAL_TABLET | Freq: Every day | ORAL | Status: DC
  Administered 2024-08-06: 10:00:00 50 mg via ORAL
  Filled 2024-08-05: qty 2

## 2024-08-05 MED ORDER — ONDANSETRON HCL 4 MG/2ML IJ SOLN: 4.0000 mg | Freq: Four times a day (QID) | INTRAMUSCULAR | Status: DC | PRN

## 2024-08-05 MED ORDER — INSULIN ASPART 100 UNIT/ML IJ SOLN
0.0000 [IU] | Freq: Every day | INTRAMUSCULAR | Status: DC
  Administered 2024-08-05: 23:00:00 3 [IU] via SUBCUTANEOUS
  Administered 2024-08-06: 21:00:00 4 [IU] via SUBCUTANEOUS
  Administered 2024-08-07: 2 [IU] via SUBCUTANEOUS
  Filled 2024-08-05: qty 2
  Filled 2024-08-05: qty 3
  Filled 2024-08-05: qty 4

## 2024-08-05 MED ORDER — OXYCODONE HCL 5 MG PO TABS
5.0000 mg | ORAL_TABLET | ORAL | Status: DC | PRN
  Administered 2024-08-06 – 2024-08-07 (×2): 5 mg via ORAL
  Filled 2024-08-05 (×2): qty 1

## 2024-08-05 NOTE — H&P (Signed)
 History and Physical    Ellen Simmons FMW:[id number] DOB: 17-Apr-1961 DOA: 08/05/2024  PCP: [name], MD   Patient coming from: Home   Chief Complaint: Cough, wheezing, SOB, loose stools   HPI: Ellen Simmons is a 63 y.o. female with medical history significant for hypertension, type 2 diabetes mellitus, chronic HFmrEF, and prior substance abuse who presents with cough, wheezing, shortness of breath, and loose stools.  Patient reports developing cough, rhinorrhea, and general malaise close to a week ago.  The rhinorrhea has resolved but she is experiencing a productive cough with shortness of breath, severe wheezing, and has had a couple loose stools daily for the past 3 or 4 days.  She reports some rib soreness that she attributes to coughing but no exertional chest pain.  She denies known sick contacts.  ED Course: Upon arrival to the ED, patient is found to be afebrile and saturating mid 90s on room air with tachypnea, tachycardia, and elevated blood pressure.  BMP and CBC are essentially normal and COVID, influenza, and RSV PCR are negative.  There are no acute findings on chest x-ray.  Patient was treated in the ED with IV Solu-Medrol , Tessalon , and Atrovent  and albuterol  x 3.  She reports some improvement with this but continues to be dyspneic at rest with severe bronchospasm.  Review of Systems:  All other systems reviewed and apart from HPI, are negative.  Past Medical History:  Diagnosis Date   Chronic heart failure with mildly reduced ejection fraction (HFmrEF) (HCC) 08/05/2024   Hemorrhoids    Hypertension    Non-insulin  dependent type 2 diabetes mellitus (HCC) 08/05/2024    History reviewed. No pertinent surgical history.  Social History:   reports that she has been smoking cigarettes. She has never used smokeless tobacco. She reports current alcohol use. She reports current drug use. Drug: Marijuana.  Allergies[1]  History reviewed. No pertinent family  history.   Prior to Admission medications  Medication Sig Start Date End Date Taking? Authorizing Provider  acetaminophen  (TYLENOL ) 500 MG tablet Take 1,000 mg by mouth every 6 (six) hours as needed for mild pain.   Yes [provider]  amLODipine  (NORVASC ) 5 MG tablet Take 1 tablet (5 mg total) by mouth daily. 07/12/21 08/05/24 Yes Shona Terry SAILOR, DO  cyclobenzaprine  (FLEXERIL ) 10 MG tablet Take 10 mg by mouth 3 (three) times daily as needed.   Yes [provider]  hydrOXYzine  (ATARAX /VISTARIL ) 25 MG tablet Take 1 tablet (25 mg total) by mouth 3 (three) times daily as needed for anxiety. 07/12/21  Yes Shona Terry SAILOR, DO  losartan -hydrochlorothiazide  (HYZAAR) 100-12.5 MG tablet Take 1 tablet by mouth daily. 06/24/24  Yes [provider]  meloxicam (MOBIC) 15 MG tablet Take 15 mg by mouth daily as needed. 04/23/24  Yes [provider]  metFORMIN (GLUCOPHAGE) 1000 MG tablet Take 1,000 mg by mouth 2 (two) times daily with a meal.   Yes [provider]  MOUNJARO 2.5 MG/0.5ML Pen Inject 2.5 mg into the skin once a week. 07/22/24  Yes [provider]  omeprazole  (PRILOSEC) 20 MG capsule Take 1 capsule (20 mg total) by mouth daily. 07/12/21 08/05/24 Yes Shona Terry SAILOR, DO  polyethylene glycol (MIRALAX  / GLYCOLAX ) 17 g packet Place 17 g into feeding tube daily as needed for mild constipation. 07/12/21  Yes Shona Terry SAILOR, DO  hydrochlorothiazide  (HYDRODIURIL ) 50 MG tablet Take 1 tablet (50 mg total) by mouth daily. 07/12/21 10/10/21  Shona Terry SAILOR, DO  losartan  (COZAAR ) 100 MG tablet Take 1 tablet (100 mg total) by mouth daily. 07/12/21 10/10/21  Shona Terry SAILOR, DO  methylPREDNISolone  (MEDROL  DOSEPAK) 4 MG TBPK tablet Take as directed Patient not taking: Reported on 01/31/2022 12/13/21   Joya Stabs, DPM    Physical Exam: Vitals:   08/05/24 2000 08/05/24 2245 08/05/24 2310 08/05/24 2315  BP: (!) 183/113 (!) 157/90  (!) 169/94  Pulse: (!) 123 100 100  96  Resp: 20 18  14   Temp:   99.1 F (37.3 C)   TempSrc:   Oral   SpO2: 95% 94% 94% 92%  Weight:      Height:         Constitutional: No pallor. No diaphoresis.  Eyes: PERTLA, lids and conjunctivae normal ENMT: Mucous membranes are moist. Posterior pharynx clear of any exudate or lesions.   Neck: supple, no masses  Respiratory: Dyspneic with speech. Prolonged expiratory phase with diffuse wheezes.  Cardiovascular: S1 & S2 heard, regular rate and rhythm. No extremity edema.   Abdomen: No tenderness, soft. Bowel sounds active.  Musculoskeletal: no clubbing / cyanosis. No joint deformity upper and lower extremities.   Skin: no significant rashes, lesions, ulcers. Warm, dry, well-perfused. Neurologic: CN 2-12 grossly intact. Moving all extremities. Alert and oriented.  Psychiatric: Calm. Cooperative.    Labs and Imaging on Admission: I have personally reviewed following labs and imaging studies  CBC: Recent Labs  Lab 08/05/24 1754  WBC 9.6  HGB 14.7  HCT 44.7  MCV 82.3  PLT 240   Basic Metabolic Panel: Recent Labs  Lab 08/05/24 1754  NA 138  K 3.8  CL 101  CO2 25  GLUCOSE 152*  BUN 12  CREATININE 0.59  CALCIUM 9.7   GFR: Estimated Creatinine Clearance: 80.6 mL/min (by C-G formula based on SCr of 0.59 mg/dL). Liver Function Tests: No results for input(s): AST, ALT, ALKPHOS, BILITOT, PROT, ALBUMIN in the last 168 hours. No results for input(s): LIPASE, AMYLASE in the last 168 hours. No results for input(s): AMMONIA in the last 168 hours. Coagulation Profile: No results for input(s): INR, PROTIME in the last 168 hours. Cardiac Enzymes: No results for input(s): CKTOTAL, CKMB, CKMBINDEX, TROPONINI in the last 168 hours. BNP (last 3 results) No results for input(s): PROBNP in the last 8760 hours. HbA1C: No results for input(s): HGBA1C in the last 72 hours. CBG: Recent Labs  Lab 08/05/24 2249  GLUCAP 299*   Lipid Profile: No  results for input(s): CHOL, HDL, LDLCALC, TRIG, CHOLHDL, LDLDIRECT in the last 72 hours. Thyroid Function Tests: No results for input(s): TSH, T4TOTAL, FREET4, T3FREE, THYROIDAB in the last 72 hours. Anemia Panel: No results for input(s): VITAMINB12, FOLATE, FERRITIN, TIBC, IRON, RETICCTPCT in the last 72 hours. Urine analysis:    Component Value Date/Time   COLORURINE STRAW (A) 07/04/2021 0750   APPEARANCEUR HAZY (A) 07/04/2021 0750   LABSPEC 1.006 07/04/2021 0750   PHURINE 6.0 07/04/2021 0750   GLUCOSEU >=500 (A) 07/04/2021 0750   HGBUR SMALL (A) 07/04/2021 0750   BILIRUBINUR NEGATIVE 07/04/2021 0750   KETONESUR NEGATIVE 07/04/2021 0750   PROTEINUR 100 (A) 07/04/2021 0750   NITRITE NEGATIVE 07/04/2021 0750   LEUKOCYTESUR NEGATIVE 07/04/2021 0750   Sepsis Labs: @LABRCNTIP (procalcitonin:4,lacticidven:4) ) Recent Results (from the past 240 hours)  Group A Strep by PCR if patient complains of sore throat.     Status: None   Collection Time: 08/05/24  5:44 PM   Specimen: Throat; Sterile Swab  Result Value Ref Range  Status   Group A Strep by PCR NOT DETECTED NOT DETECTED Final    Comment: Performed at [facility], [address], [place], [place] [address]  Resp panel by RT-PCR (RSV, Flu A&B, Covid) Throat     Status: None   Collection Time: 08/05/24  5:44 PM   Specimen: Throat; Nasal Swab  Result Value Ref Range Status   SARS Coronavirus 2 by RT PCR NEGATIVE NEGATIVE Final    Comment: (NOTE) SARS-CoV-2 target nucleic acids are NOT DETECTED.  The SARS-CoV-2 RNA is generally detectable in upper respiratory specimens during the acute phase of infection. The lowest concentration of SARS-CoV-2 viral copies this assay can detect is 138 copies/mL. A negative result does not preclude SARS-Cov-2 infection and should not be used as the sole basis for treatment or other patient management decisions. A negative result may occur with   improper specimen collection/handling, submission of specimen other than nasopharyngeal swab, presence of viral mutation(s) within the areas targeted by this assay, and inadequate number of viral copies(<138 copies/mL). A negative result must be combined with clinical observations, patient history, and epidemiological information. The expected result is Negative.  Fact Sheet for Patients:  bloggercourse.com  Fact Sheet for Healthcare Providers:  seriousbroker.it  This test is no t yet approved or cleared by the [place]  FDA and  has been authorized for detection and/or diagnosis of SARS-CoV-2 by FDA under an Emergency Use Authorization (EUA). This EUA will remain  in effect (meaning this test can be used) for the duration of the COVID-19 declaration under Section 564(b)(1) of the Act, 21 U.S.C.section 360bbb-3(b)(1), unless the authorization is terminated  or revoked sooner.       Influenza A by PCR NEGATIVE NEGATIVE Final   Influenza B by PCR NEGATIVE NEGATIVE Final    Comment: (NOTE) The Xpert Xpress SARS-CoV-2/FLU/RSV plus assay is intended as an aid in the diagnosis of influenza from Nasopharyngeal swab specimens and should not be used as a sole basis for treatment. Nasal washings and aspirates are unacceptable for Xpert Xpress SARS-CoV-2/FLU/RSV testing.  Fact Sheet for Patients: bloggercourse.com  Fact Sheet for Healthcare Providers: seriousbroker.it  This test is not yet approved or cleared by the [place]  FDA and has been authorized for detection and/or diagnosis of SARS-CoV-2 by FDA under an Emergency Use Authorization (EUA). This EUA will remain in effect (meaning this test can be used) for the duration of the COVID-19 declaration under Section 564(b)(1) of the Act, 21 U.S.C. section 360bbb-3(b)(1), unless the authorization is terminated or revoked.      Resp Syncytial Virus by PCR NEGATIVE NEGATIVE Final    Comment: (NOTE) Fact Sheet for Patients: bloggercourse.com  Fact Sheet for Healthcare Providers: seriousbroker.it  This test is not yet approved or cleared by the [place]  FDA and has been authorized for detection and/or diagnosis of SARS-CoV-2 by FDA under an Emergency Use Authorization (EUA). This EUA will remain in effect (meaning this test can be used) for the duration of the COVID-19 declaration under Section 564(b)(1) of the Act, 21 U.S.C. section 360bbb-3(b)(1), unless the authorization is terminated or revoked.  Performed at [facility], [address], [place], [place] [address]      Radiological Exams on Admission: DG Chest Port 1 View Result Date: 08/05/2024 EXAM: 1 VIEW(S) XRAY OF THE CHEST 08/05/2024 06:27:00 PM COMPARISON: None available. CLINICAL HISTORY: SOB FINDINGS: LUNGS AND PLEURA: Bilateral linear scarring or subsegmental atelectasis. No pleural effusion. No pneumothorax. HEART AND MEDIASTINUM: No  acute abnormality of the cardiac and mediastinal silhouettes. BONES AND SOFT TISSUES: Thoracic degenerative changes. IMPRESSION: 1. No acute cardiopulmonary findings. 2. Bilateral linear scarring or subsegmental atelectasis. 3. Thoracic degenerative changes. Electronically signed by: Dorethia Molt MD 08/05/2024 06:38 PM EST RP Workstation: [id number]    Assessment/Plan   1. Acute bronchitis with bronchospasm  - No acute findings on CXR; covid, influenza, and RSV pcr are negative  - Check expanded respiratory virus panel, continue systemic steroids, bronchodilators, and supportive care   2. Hypertensive urgency  - BP severely elevated without target-organ damage  - Continue Norvasc , hydrochlorothiazide , and losartan , use oral hydralazine  if needed    3. Type II DM  - Check CBGs and use low-intensity SSI for now    4. Chronic HFmrEF   - EF was 45-50% on echo from November 2022  - Appears compensate     DVT prophylaxis: Lovenox   Code Status: Full  Level of Care: Level of care: Telemetry Family Communication: None present   Disposition Plan:  Patient is from: Home  Anticipated d/c is to: Home Anticipated d/c date is: 12/18 or 08/07/24  Patient currently: Pending improved respiratory status  Consults called: None   Admission status: Observation     Evalene GORMAN Sprinkles, MD [facility]  08/05/2024, 11:18 PM       [1] No Known Allergies

## 2024-08-05 NOTE — ED Triage Notes (Signed)
 Pt came in POV c/o SOB, wheezing, chest congestion that started about a week ago. Diarrhea for 4 days. No N&V. Pain in chest area due to excess coughing. Pt sounds very congested & audible wheezing present.

## 2024-08-05 NOTE — ED Provider Notes (Signed)
 [place] EMERGENCY DEPARTMENT AT [facility] Provider Note   CSN: [id number] Arrival date & time: 08/05/24  1726     Patient presents with: Shortness of Breath and Wheezing   Ellen Simmons is a 63 y.o. female history of hypertension, here presenting with shortness of breath and cough and wheezing.  Symptoms started about a week ago.  Patient states that every time she coughs she noticed that she is wheezing.  Denies any sick contacts.  Patient states that she did not get the flu shot this year.   The history is provided by the patient.       Prior to Admission medications  Medication Sig Start Date End Date Taking? Authorizing Provider  acetaminophen  (TYLENOL ) 500 MG tablet Take 1,000 mg by mouth every 6 (six) hours as needed for mild pain.    [provider]  amLODipine  (NORVASC ) 5 MG tablet Take 1 tablet (5 mg total) by mouth daily. 07/12/21 10/10/21  Shona Terry SAILOR, DO  cyclobenzaprine  (FLEXERIL ) 10 MG tablet Take 10 mg by mouth at bedtime.    [provider]  hydrochlorothiazide  (HYDRODIURIL ) 50 MG tablet Take 1 tablet (50 mg total) by mouth daily. 07/12/21 10/10/21  Shona Terry SAILOR, DO  hydrOXYzine  (ATARAX /VISTARIL ) 25 MG tablet Take 1 tablet (25 mg total) by mouth 3 (three) times daily as needed for anxiety. 07/12/21   Shona Terry SAILOR, DO  loratadine  (CLARITIN ) 10 MG tablet Take 10 mg by mouth daily.    [provider]  losartan  (COZAAR ) 100 MG tablet Take 1 tablet (100 mg total) by mouth daily. 07/12/21 10/10/21  Shona Terry SAILOR, DO  methylPREDNISolone  (MEDROL  DOSEPAK) 4 MG TBPK tablet Take as directed Patient not taking: Reported on 01/31/2022 12/13/21   Sikora, Rebecca, DPM  omeprazole  (PRILOSEC) 20 MG capsule Take 1 capsule (20 mg total) by mouth daily. 07/12/21 10/10/21  Shona Terry SAILOR, DO  polyethylene glycol (MIRALAX  / GLYCOLAX ) 17 g packet Place 17 g into feeding tube daily as needed for mild constipation. 07/12/21   Shona Terry SAILOR, DO     Allergies: Patient has no known allergies.    Review of Systems  Respiratory:  Positive for shortness of breath and wheezing.   All other systems reviewed and are negative.   Updated Vital Signs BP (!) 187/93 (BP Location: Left Arm)   Pulse (!) 53   Temp 98.6 F (37 C) (Oral)   Resp 19   Ht 5' 2 (1.575 m)   Wt 97.5 kg   SpO2 99%   BMI 39.32 kg/m   Physical Exam Vitals and nursing note reviewed.  HENT:     Head: Normocephalic.     Mouth/Throat:     Mouth: Mucous membranes are moist.  Eyes:     Extraocular Movements: Extraocular movements intact.     Pupils: Pupils are equal, round, and reactive to light.  Cardiovascular:     Rate and Rhythm: Normal rate and regular rhythm.  Pulmonary:     Comments: Patient has diffuse wheezing Musculoskeletal:        General: Normal range of motion.     Cervical back: Normal range of motion and neck supple.  Skin:    General: Skin is warm.     Capillary Refill: Capillary refill takes less than 2 seconds.  Neurological:     General: No focal deficit present.     Mental Status: She is alert and oriented to person, place, and time.  Psychiatric:  Mood and Affect: Mood normal.        Behavior: Behavior normal.     (all labs ordered are listed, but only abnormal results are displayed) Labs Reviewed  BASIC METABOLIC PANEL WITH GFR - Abnormal; Notable for the following components:      Result Value   Glucose, Bld 152 (*)    All other components within normal limits  CBC - Abnormal; Notable for the following components:   RBC 5.43 (*)    All other components within normal limits  GROUP A STREP BY PCR  RESP PANEL BY RT-PCR (RSV, FLU A&B, COVID)  [id number]  URINE DRUG SCREEN    EKG: None  Radiology: [facility] Chest Port 1 View Result Date: 08/05/2024 EXAM: 1 VIEW(S) XRAY OF THE CHEST 08/05/2024 06:27:00 PM COMPARISON: None available. CLINICAL HISTORY: SOB FINDINGS: LUNGS AND PLEURA: Bilateral linear scarring or subsegmental  atelectasis. No pleural effusion. No pneumothorax. HEART AND MEDIASTINUM: No acute abnormality of the cardiac and mediastinal silhouettes. BONES AND SOFT TISSUES: Thoracic degenerative changes. IMPRESSION: 1. No acute cardiopulmonary findings. 2. Bilateral linear scarring or subsegmental atelectasis. 3. Thoracic degenerative changes. Electronically signed by: Dorethia Molt MD 08/05/2024 06:38 PM EST RP Workstation: [id number]     Procedures   Medications Ordered in the ED  albuterol  (PROVENTIL ) (2.5 MG/3ML) 0.083% nebulizer solution 5 mg (5 mg Nebulization Given 08/05/24 1827)  ipratropium (ATROVENT ) nebulizer solution 0.5 mg (0.5 mg Nebulization Given 08/05/24 1827)  benzonatate  (TESSALON ) capsule 100 mg (100 mg Oral Given 08/05/24 1826)                                    Medical Decision Making Ellen Simmons is a 63 y.o. female here presenting with cough and congestion and wheezing.  Consider bronchitis versus pneumonia versus COVID and flu or RSV.  Will test for COVID flu RSV and get labs and chest x-ray.  8:53 PM COVID and flu and RSV are negative.  Strep is negative and labs are unremarkable.  Chest x-ray is clear.  However patient is persistently wheezing despite nebs and steroids.  Will admit for bronchitis.  Problems Addressed: Bronchitis: acute illness or injury  Amount and/or Complexity of Data Reviewed Labs: ordered. Decision-making details documented in ED Course. Radiology: ordered and independent interpretation performed. Decision-making details documented in ED Course.  Risk Prescription drug management. Decision regarding hospitalization.    Final diagnoses:  None    ED Discharge Orders     None          Patt Alm Macho, MD 08/05/24 2054

## 2024-08-06 DIAGNOSIS — J209 Acute bronchitis, unspecified: Secondary | ICD-10-CM | POA: Diagnosis present

## 2024-08-06 DIAGNOSIS — J441 Chronic obstructive pulmonary disease with (acute) exacerbation: Secondary | ICD-10-CM | POA: Diagnosis present

## 2024-08-06 DIAGNOSIS — E6609 Other obesity due to excess calories: Secondary | ICD-10-CM

## 2024-08-06 DIAGNOSIS — K649 Unspecified hemorrhoids: Secondary | ICD-10-CM | POA: Diagnosis present

## 2024-08-06 DIAGNOSIS — Z7985 Long-term (current) use of injectable non-insulin antidiabetic drugs: Secondary | ICD-10-CM | POA: Diagnosis not present

## 2024-08-06 DIAGNOSIS — J204 Acute bronchitis due to parainfluenza virus: Secondary | ICD-10-CM | POA: Diagnosis present

## 2024-08-06 DIAGNOSIS — Z79899 Other long term (current) drug therapy: Secondary | ICD-10-CM | POA: Diagnosis not present

## 2024-08-06 DIAGNOSIS — I110 Hypertensive heart disease with heart failure: Secondary | ICD-10-CM | POA: Diagnosis present

## 2024-08-06 DIAGNOSIS — Z1152 Encounter for screening for COVID-19: Secondary | ICD-10-CM | POA: Diagnosis not present

## 2024-08-06 DIAGNOSIS — F17210 Nicotine dependence, cigarettes, uncomplicated: Secondary | ICD-10-CM | POA: Diagnosis present

## 2024-08-06 DIAGNOSIS — Z7984 Long term (current) use of oral hypoglycemic drugs: Secondary | ICD-10-CM | POA: Diagnosis not present

## 2024-08-06 DIAGNOSIS — I5022 Chronic systolic (congestive) heart failure: Secondary | ICD-10-CM | POA: Diagnosis present

## 2024-08-06 DIAGNOSIS — I160 Hypertensive urgency: Secondary | ICD-10-CM | POA: Diagnosis present

## 2024-08-06 DIAGNOSIS — E66812 Obesity, class 2: Secondary | ICD-10-CM | POA: Diagnosis present

## 2024-08-06 DIAGNOSIS — F17200 Nicotine dependence, unspecified, uncomplicated: Secondary | ICD-10-CM | POA: Diagnosis present

## 2024-08-06 DIAGNOSIS — Z6839 Body mass index (BMI) 39.0-39.9, adult: Secondary | ICD-10-CM | POA: Diagnosis not present

## 2024-08-06 DIAGNOSIS — T380X5A Adverse effect of glucocorticoids and synthetic analogues, initial encounter: Secondary | ICD-10-CM | POA: Diagnosis present

## 2024-08-06 DIAGNOSIS — E119 Type 2 diabetes mellitus without complications: Secondary | ICD-10-CM | POA: Diagnosis present

## 2024-08-06 DIAGNOSIS — Z794 Long term (current) use of insulin: Secondary | ICD-10-CM | POA: Diagnosis not present

## 2024-08-06 DIAGNOSIS — F1411 Cocaine abuse, in remission: Secondary | ICD-10-CM | POA: Diagnosis present

## 2024-08-06 LAB — RESPIRATORY PANEL BY PCR

## 2024-08-06 LAB — BASIC METABOLIC PANEL WITH GFR
Anion gap: 16 — ABNORMAL HIGH (ref 5–15)
BUN: 13 mg/dL (ref 8–23)
CO2: 22 mmol/L (ref 22–32)
Calcium: 9.5 mg/dL (ref 8.9–10.3)
Chloride: 96 mmol/L — ABNORMAL LOW (ref 98–111)
Creatinine, Ser: 0.7 mg/dL (ref 0.44–1.00)
GFR, Estimated: >60 mL/min (ref >60)
Glucose, Bld: 369 mg/dL — ABNORMAL HIGH (ref 70–99)
Potassium: 3.9 mmol/L (ref 3.5–5.1)
Sodium: 135 mmol/L (ref 135–145)

## 2024-08-06 LAB — GLUCOSE, CAPILLARY
Glucose-Capillary: 305 mg/dL — ABNORMAL HIGH (ref 70–99)
Glucose-Capillary: 252 mg/dL — ABNORMAL HIGH (ref 70–99)
Glucose-Capillary: 335 mg/dL — ABNORMAL HIGH (ref 70–99)
Glucose-Capillary: 342 mg/dL — ABNORMAL HIGH (ref 70–99)

## 2024-08-06 LAB — CBC
HCT: 41.4 % (ref 36.0–46.0)
Hemoglobin: 13.9 g/dL (ref 12.0–15.0)
MCH: 27.1 pg (ref 26.0–34.0)
MCHC: 33.6 g/dL (ref 30.0–36.0)
MCV: 80.9 fL (ref 80.0–100.0)
Platelets: 217 K/uL (ref 150–400)
RBC: 5.12 MIL/uL — ABNORMAL HIGH (ref 3.87–5.11)
RDW: 14.1 % (ref 11.5–15.5)
WBC: 8.2 K/uL (ref 4.0–10.5)
nRBC: 0 % (ref 0.0–0.2)

## 2024-08-06 LAB — HEMOGLOBIN A1C
Hgb A1c MFr Bld: 11.8 % — ABNORMAL HIGH (ref 4.8–5.6)
Mean Plasma Glucose: 291.96 mg/dL

## 2024-08-06 LAB — HIV ANTIBODY (ROUTINE TESTING W REFLEX): HIV Screen 4th Generation wRfx: NONREACTIVE

## 2024-08-06 MED ORDER — ENOXAPARIN SODIUM 60 MG/0.6ML IJ SOSY
50.0000 mg | PREFILLED_SYRINGE | INTRAMUSCULAR | Status: DC
  Administered 2024-08-07: 50 mg via SUBCUTANEOUS
  Filled 2024-08-06 (×2): qty 0.6

## 2024-08-06 MED ORDER — GUAIFENESIN ER 600 MG PO TB12
600.0000 mg | ORAL_TABLET | Freq: Two times a day (BID) | ORAL | Status: DC
  Administered 2024-08-06 – 2024-08-08 (×4): 600 mg via ORAL
  Filled 2024-08-06 (×4): qty 1

## 2024-08-06 MED ORDER — HYDROCHLOROTHIAZIDE 25 MG PO TABS
25.0000 mg | ORAL_TABLET | Freq: Every day | ORAL | Status: DC
  Administered 2024-08-07 – 2024-08-08 (×2): 25 mg via ORAL
  Filled 2024-08-06 (×2): qty 1

## 2024-08-06 MED ORDER — HYDRALAZINE HCL 20 MG/ML IJ SOLN
5.0000 mg | INTRAMUSCULAR | Status: DC | PRN
  Administered 2024-08-06 (×2): 5 mg via INTRAVENOUS
  Filled 2024-08-06 (×3): qty 1

## 2024-08-06 MED ORDER — INSULIN GLARGINE-YFGN 100 UNIT/ML ~~LOC~~ SOLN
15.0000 [IU] | Freq: Every day | SUBCUTANEOUS | Status: DC
  Administered 2024-08-06: 21:00:00 15 [IU] via SUBCUTANEOUS
  Filled 2024-08-06: qty 0.15

## 2024-08-06 MED ORDER — LACTATED RINGERS IV BOLUS
500.0000 mL | Freq: Once | INTRAVENOUS | Status: AC
  Administered 2024-08-06: 13:00:00 500 mL via INTRAVENOUS

## 2024-08-06 MED ORDER — INSULIN ASPART 100 UNIT/ML IJ SOLN
3.0000 [IU] | Freq: Three times a day (TID) | INTRAMUSCULAR | Status: DC
  Administered 2024-08-07 – 2024-08-08 (×5): 3 [IU] via SUBCUTANEOUS
  Filled 2024-08-06 (×5): qty 3

## 2024-08-06 MED ORDER — NICOTINE 14 MG/24HR TD PT24: 14.0000 mg | MEDICATED_PATCH | Freq: Every day | TRANSDERMAL | Status: DC | PRN

## 2024-08-06 MED ORDER — INSULIN ASPART 100 UNIT/ML IJ SOLN
0.0000 [IU] | Freq: Three times a day (TID) | INTRAMUSCULAR | Status: DC
  Administered 2024-08-07: 8 [IU] via SUBCUTANEOUS
  Administered 2024-08-07: 11 [IU] via SUBCUTANEOUS
  Administered 2024-08-07: 5 [IU] via SUBCUTANEOUS
  Administered 2024-08-08: 2 [IU] via SUBCUTANEOUS
  Administered 2024-08-08: 5 [IU] via SUBCUTANEOUS
  Filled 2024-08-06: qty 11
  Filled 2024-08-06: qty 2
  Filled 2024-08-06 (×2): qty 5
  Filled 2024-08-06: qty 8

## 2024-08-06 MED ORDER — IPRATROPIUM-ALBUTEROL 0.5-2.5 (3) MG/3ML IN SOLN
3.0000 mL | RESPIRATORY_TRACT | Status: DC
  Administered 2024-08-06 – 2024-08-08 (×12): 3 mL via RESPIRATORY_TRACT
  Filled 2024-08-06 (×13): qty 3

## 2024-08-06 MED ORDER — AMLODIPINE BESYLATE 10 MG PO TABS
10.0000 mg | ORAL_TABLET | Freq: Every day | ORAL | Status: DC
  Administered 2024-08-07 – 2024-08-08 (×2): 10 mg via ORAL
  Filled 2024-08-06 (×2): qty 1

## 2024-08-06 NOTE — Hospital Course (Signed)
 63yo with h/o HTN, T2DM, chronic HFrEF, and substance use d/o who presented on 12/17 with symptoms suggestive of viral illness.  She was given Solumedrol for wheezing as well as Duonebs and Tessalon .  She was diagnosed with acute bronchitis and HTN urgency.

## 2024-08-06 NOTE — Assessment & Plan Note (Addendum)
 Body mass index is 39.32 kg/m.Ellen Simmons  Weight loss should be encouraged on an ongoing basis Hold Mounjaro Outpatient PCP/bariatric medicine f/u encouraged Significantly low or high BMI is associated with higher medical risk including morbidity and mortality

## 2024-08-06 NOTE — Assessment & Plan Note (Addendum)
 EF was 45-50% on echo from November 2022  Appears compensated

## 2024-08-06 NOTE — Assessment & Plan Note (Addendum)
 No acute findings on CXR; covid, influenza, and RSV pcr are negative  RVP positive for parainfluenza virus Continue systemic steroids, bronchodilators, and supportive care On room air Encourage ambulation Anticipate dc tomorrow

## 2024-08-06 NOTE — Assessment & Plan Note (Addendum)
 A1c is 11.8 very poor control Hold metformin Add glargine 15 units at bedtime Cover with 3 units mealtime insulin  Cover with moderate-scale SSI Carb modified diet

## 2024-08-06 NOTE — Assessment & Plan Note (Signed)
-  Encourage cessation.   °-This was discussed with the patient and should be reviewed on an ongoing basis.   °-Patch ordered at patient request. °

## 2024-08-06 NOTE — Plan of Care (Signed)
   Problem: Coping: Goal: Ability to adjust to condition or change in health will improve Outcome: Progressing   Problem: Fluid Volume: Goal: Ability to maintain a balanced intake and output will improve Outcome: Progressing   Problem: Health Behavior/Discharge Planning: Goal: Ability to identify and utilize available resources and services will improve Outcome: Progressing

## 2024-08-06 NOTE — Assessment & Plan Note (Addendum)
 UDS negative on presentation

## 2024-08-06 NOTE — Assessment & Plan Note (Addendum)
 BP severely elevated without target-organ damage  Continue amlodipine  (increase to 10 mg daily), hydrochlorothiazide  (decrease to 25 mg daily as there is no real utility in higher doses), and losartan  PRN IV hydralazine 

## 2024-08-06 NOTE — Plan of Care (Signed)

## 2024-08-06 NOTE — Inpatient Diabetes Management (Signed)
 Inpatient Diabetes Program Recommendations  AACE/ADA: New Consensus Statement on Inpatient Glycemic Control (2015)  Target Ranges:  Prepandial:   less than 140 mg/dL      Peak postprandial:   less than 180 mg/dL (1-2 hours)      Critically ill patients:  140 - 180 mg/dL   Lab Results  Component Value Date   GLUCAP 305 (H) 08/06/2024   HGBA1C 6.3 (H) 07/05/2021    Review of Glycemic Control  Latest Reference Range & Units 08/05/24 22:49 08/06/24 08:08  Glucose-Capillary 70 - 99 mg/dL 700 (H) 694 (H)  (H): Data is abnormally high  Diabetes history: DM2  Outpatient Diabetes medications:  Metformin 1000 mg BID Mounjaro 2.5 mg weekly  Current orders for Inpatient glycemic control:  Novolog  0-6 units TID and 0-5 units at bedtime Prednisone  50 mg QAM Had Solumedrol 125 mg on 12/17  Inpatient Diabetes Program Recommendations:    Please consider:  Semglee  15 units every day (0.15 units/kg) Novolog  3 units TID with meals if she consumes at least 50% Carb modified diet  Thank you, Wyvonna Pinal, MSN, CDCES Diabetes Coordinator Inpatient Diabetes Program [phone] (team pager from 8a-5p)

## 2024-08-06 NOTE — Progress Notes (Addendum)
 Progress Note   Patient: Ellen Simmons FMW:[id number] DOB: 1961-08-06 DOA: 08/05/2024     0 DOS: the patient was seen and examined on 08/06/2024   Brief hospital course: 63yo with h/o HTN, T2DM, chronic HFrEF, and substance use d/o who presented on 12/17 with symptoms suggestive of viral illness.  She was given Solumedrol for wheezing as well as Duonebs and Tessalon .  She was diagnosed with acute bronchitis and HTN urgency.   Assessment & Plan Parainfluenza virus bronchitis Acute bronchitis with bronchospasm No acute findings on CXR; covid, influenza, and RSV pcr are negative  RVP positive for parainfluenza virus Continue systemic steroids, bronchodilators, and supportive care On room air Encourage ambulation Anticipate dc tomorrow Hypertensive urgency BP severely elevated without target-organ damage  Continue amlodipine  (increase to 10 mg daily), hydrochlorothiazide  (decrease to 25 mg daily as there is no real utility in higher doses), and losartan  PRN IV hydralazine  Chronic heart failure with mildly reduced ejection fraction (HFmrEF) (HCC) EF was 45-50% on echo from November 2022  Appears compensated  Non-insulin  dependent type 2 diabetes mellitus (HCC) A1c is 11.8 very poor control Hold metformin Add glargine 15 units at bedtime Cover with 3 units mealtime insulin  Cover with moderate-scale SSI Carb modified diet  Substance abuse (HCC) UDS negative on presentation Tobacco dependence Encourage cessation.   This was discussed with the patient and should be reviewed on an ongoing basis.   Patch ordered at patient request.  Class 2 obesity due to excess calories with body mass index (BMI) of 39.0 to 39.9 in adult Body mass index is 39.32 kg/m.SABRA  Weight loss should be encouraged on an ongoing basis Hold Mounjaro Outpatient PCP/bariatric medicine f/u encouraged Significantly low or high BMI is associated with higher medical risk including morbidity and mortality        Consultants: None  Procedures: None  Antibiotics: None      Subjective: Feeling some better but not well enough to go home.  Still wheezing.   Objective: Vitals:   08/06/24 1312 08/06/24 1708  BP: (!) 185/102   Pulse: 95   Resp: 20   Temp: 98 F (36.7 C)   SpO2: 94% 97%    Intake/Output Summary (Last 24 hours) at 08/06/2024 1744 Last data filed at 08/06/2024 1500 Gross per 24 hour  Intake 754.46 ml  Output --  Net 754.46 ml   Filed Weights   08/05/24 1740  Weight: 97.5 kg    Exam:  General:  Appears calm and comfortable and is in NAD, on RA Eyes:  normal lids, iris ENT:  grossly normal hearing, lips & tongue, mmm Cardiovascular:  RRR. No LE edema.  Respiratory:   Expiratory wheezing.  Normal respiratory effort. Abdomen:  soft, NT, ND Skin:  no rash or induration seen on limited exam Musculoskeletal:  grossly normal tone BUE/BLE, good ROM, no bony abnormality Psychiatric:  grossly normal mood and affect, speech fluent and appropriate, AOx3 Neurologic:  CN 2-12 grossly intact, moves all extremities in coordinated fashion  Data Reviewed: I have reviewed the patient's lab results since admission.  Pertinent labs for today include:   Glucose 369 Anion gap 16 Unremarkable CBC COVID/flu/RSV negative RVP pending    Family Communication: None present     Code Status: Full Code   Disposition: Status is: Inpatient Admit - It is my clinical opinion that admission to INPATIENT is reasonable and necessary because of the expectation that this patient will require hospital care that crosses at least 2 midnights to treat  this condition based on the medical complexity of the problems presented.  Given the aforementioned information, the predictability of an adverse outcome is felt to be significant.      Time spent: 50 minutes  Unresulted Labs (From admission, onward)     Start     Ordered   08/06/24 0500  Basic metabolic panel  Daily,   R       08/05/24 2219   08/06/24 0500  CBC  Daily,   R      08/05/24 2219             Author: [name], MD 08/06/2024 5:44 PM  For on call review [email or web address].

## 2024-08-07 DIAGNOSIS — J204 Acute bronchitis due to parainfluenza virus: Secondary | ICD-10-CM | POA: Diagnosis not present

## 2024-08-07 DIAGNOSIS — E878 Other disorders of electrolyte and fluid balance, not elsewhere classified: Secondary | ICD-10-CM | POA: Insufficient documentation

## 2024-08-07 LAB — GLUCOSE, CAPILLARY
Glucose-Capillary: 231 mg/dL — ABNORMAL HIGH (ref 70–99)
Glucose-Capillary: 258 mg/dL — ABNORMAL HIGH (ref 70–99)
Glucose-Capillary: 304 mg/dL — ABNORMAL HIGH (ref 70–99)
Glucose-Capillary: 243 mg/dL — ABNORMAL HIGH (ref 70–99)

## 2024-08-07 MED ORDER — INSULIN GLARGINE-YFGN 100 UNIT/ML ~~LOC~~ SOLN
15.0000 [IU] | Freq: Two times a day (BID) | SUBCUTANEOUS | Status: DC
  Administered 2024-08-07 – 2024-08-08 (×3): 15 [IU] via SUBCUTANEOUS
  Filled 2024-08-07 (×4): qty 0.15

## 2024-08-07 NOTE — Assessment & Plan Note (Deleted)
 UDS negative on presentation

## 2024-08-07 NOTE — Progress Notes (Signed)
" °   08/07/24 1652  TOC Brief Assessment  Insurance and Status Reviewed  Patient has primary care physician Yes Ruthy, Talitha, MD)  Home environment has been reviewed from home  Prior level of function: independent  Prior/Current Home Services No current home services  Social Drivers of Health Review SDOH reviewed no interventions necessary  Readmission risk has been reviewed Yes  Transition of care needs no transition of care needs at this time    "

## 2024-08-07 NOTE — Plan of Care (Signed)
  Problem: Metabolic: Goal: Ability to maintain appropriate glucose levels will improve Outcome: Progressing   Problem: Skin Integrity: Goal: Risk for impaired skin integrity will decrease Outcome: Progressing   Problem: Activity: Goal: Risk for activity intolerance will decrease Outcome: Progressing   Problem: Pain Managment: Goal: General experience of comfort will improve and/or be controlled Outcome: Progressing   Problem: Safety: Goal: Ability to remain free from injury will improve Outcome: Progressing

## 2024-08-07 NOTE — Assessment & Plan Note (Addendum)
 Body mass index is 39.32 kg/m.Ellen Simmons  Weight loss should be encouraged on an ongoing basis Hold Mounjaro while inpatient, resume at dc Outpatient PCP/bariatric medicine f/u encouraged Significantly low or high BMI is associated with higher medical risk including morbidity and mortality

## 2024-08-07 NOTE — Assessment & Plan Note (Deleted)
 EF was 45-50% on echo from November 2022  Appears compensated

## 2024-08-07 NOTE — Assessment & Plan Note (Addendum)
 No acute findings on CXR; covid, influenza, and RSV pcr are negative  RVP positive for parainfluenza virus Continue systemic steroids, bronchodilators, and supportive care On room air Encourage ambulation Anticipate dc tomorrow or once breathing stabilizes Will add IS and flutter valve Will plan to leave out of work ([place]) until 12/30

## 2024-08-07 NOTE — Assessment & Plan Note (Addendum)
 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

## 2024-08-07 NOTE — Assessment & Plan Note (Signed)
 Noted to have an elevated anion gap on 12/18 Given 500 cc IVF bolus Thought to be related to mild dehydration in the setting of acute illness Will recheck in AM

## 2024-08-07 NOTE — Plan of Care (Signed)

## 2024-08-07 NOTE — Assessment & Plan Note (Deleted)
 No acute findings on CXR; covid, influenza, and RSV pcr are negative  RVP positive for parainfluenza virus Continue systemic steroids, bronchodilators, and supportive care On room air Encourage ambulation Anticipate dc tomorrow

## 2024-08-07 NOTE — Assessment & Plan Note (Addendum)
-  Encourage cessation.   ?-This was discussed with the patient and should be reviewed on an ongoing basis.   ?-Patch ordered  ?

## 2024-08-07 NOTE — Assessment & Plan Note (Addendum)
 A1c is 11.8 very poor control Exacerbated by steroids Hold metformin Add glargine 15 units BID Cover with 3 units mealtime insulin  Cover with moderate-scale SSI Carb modified diet

## 2024-08-07 NOTE — Assessment & Plan Note (Deleted)
-  Encourage cessation.   °-This was discussed with the patient and should be reviewed on an ongoing basis.   °-Patch ordered at patient request. °

## 2024-08-07 NOTE — Assessment & Plan Note (Deleted)
 A1c is 11.8 very poor control Hold metformin Add glargine 15 units at bedtime Cover with 3 units mealtime insulin  Cover with moderate-scale SSI Carb modified diet

## 2024-08-07 NOTE — Assessment & Plan Note (Addendum)
 BP severely elevated without target-organ damage on presentation Continue amlodipine  (increase to 10 mg daily), hydrochlorothiazide  (decrease to 25 mg daily as there is no real utility in higher doses), and losartan  BP improved today to 126/65 PRN IV hydralazine 

## 2024-08-07 NOTE — Assessment & Plan Note (Deleted)
 Body mass index is 39.32 kg/m.Ellen Simmons  Weight loss should be encouraged on an ongoing basis Hold Mounjaro Outpatient PCP/bariatric medicine f/u encouraged Significantly low or high BMI is associated with higher medical risk including morbidity and mortality

## 2024-08-07 NOTE — Progress Notes (Addendum)
 " Progress Note   Patient: Ellen Simmons FMW:[id number] DOB: November 14, 1960 DOA: 08/05/2024     1 DOS: the patient was seen and examined on 08/07/2024   Brief hospital course: 63yo with h/o HTN, T2DM, chronic HFrEF, and substance use d/o who presented on 12/17 with symptoms suggestive of viral illness.  She was given Solumedrol for wheezing as well as Duonebs and Tessalon .  She was diagnosed with acute bronchitis and HTN urgency.   Assessment & Plan Parainfluenza virus bronchitis Acute bronchitis with bronchospasm No acute findings on CXR; covid, influenza, and RSV pcr are negative  RVP positive for parainfluenza virus Continue systemic steroids, bronchodilators, and supportive care On room air Encourage ambulation Anticipate dc tomorrow or once breathing stabilizes Will add IS and flutter valve Will plan to leave out of work ([place]) until 12/30 Hypertensive urgency BP severely elevated without target-organ damage on presentation Continue amlodipine  (increase to 10 mg daily), hydrochlorothiazide  (decrease to 25 mg daily as there is no real utility in higher doses), and losartan  BP improved today to 126/65 PRN IV hydralazine  High anion gap Noted to have an elevated anion gap on 12/18 Given 500 cc IVF bolus Thought to be related to mild dehydration in the setting of acute illness Will recheck in AM Chronic heart failure with mildly reduced ejection fraction (HFmrEF) (HCC) EF was 45-50% on echo from November 2022  Appears compensated  Non-insulin  dependent type 2 diabetes mellitus (HCC) A1c is 11.8 very poor control Exacerbated by steroids Hold metformin Add glargine 15 units BID Cover with 3 units mealtime insulin  Cover with moderate-scale SSI Carb modified diet  Substance abuse (HCC) UDS negative on presentation 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   This was discussed with the patient and should be reviewed on an ongoing basis.   Patch ordered Class 2 obesity due to excess calories with body mass index (BMI) of 39.0 to 39.9 in adult Body mass index is 39.32 kg/m.SABRA  Weight loss should be encouraged on an ongoing basis Hold Mounjaro while inpatient, resume at dc Outpatient PCP/bariatric medicine f/u encouraged Significantly low or high BMI is associated with higher medical risk including morbidity and mortality       Consultants: None   Procedures: None   Antibiotics: None    30 Day Unplanned Readmission Risk Score    Flowsheet Row ED to Hosp-Admission (Current) from 08/05/2024 in [facility]  30 Day Unplanned Readmission Risk Score (%) 10.78 Filed at 08/07/2024 0801    This score is the patient's risk of an unplanned readmission within 30 days of being discharged (0 -100%). The score is based on dignosis, age, lab data, medications, orders, and past utilization.   Low:  0-14.9   Medium: 15-21.9   High: 22-29.9   Extreme: 30 and above           Subjective: Continues to feel tight/wheezy and does not feel safe to dc today.   Objective: Vitals:   08/07/24 0314 08/07/24 0503  BP:  126/65  Pulse:  95  Resp:  16  Temp:  98.2 F (36.8 C)  SpO2: 96% 98%    Intake/Output Summary (Last 24 hours) at 08/07/2024 0823 Last data filed at 08/06/2024 1500 Gross per 24 hour  Intake 1308.18 ml  Output --  Net 1308.18 ml   Filed Weights   08/05/24 1740  Weight: 97.5 kg    Exam:  General:  Appears calm and comfortable and is in NAD, on RA Eyes:  normal lids, iris ENT:  grossly normal hearing, lips & tongue, mmm Cardiovascular:  RRR. No LE edema.  Respiratory:   Persistent expiratory wheezing.  Normal to mildly increased respiratory effort. Abdomen:  soft, NT, ND Skin:  no rash  or induration seen on limited exam Musculoskeletal:  grossly normal tone BUE/BLE, good ROM, no bony abnormality Psychiatric:  grossly normal mood and affect, speech fluent and appropriate, AOx3 Neurologic:  CN 2-12 grossly intact, moves all extremities in coordinated fashion  Data Reviewed: I have reviewed the patient's lab results since admission.  Pertinent labs for today include:   Glucose 231     Family Communication: None present; she reports that her daughter is updated and does not need to be called     Code Status: Full Code   Disposition: Status is: Inpatient Remains inpatient appropriate because: ongoing monitoring     Time spent: 50 minutes  Unresulted Labs (From admission, onward)     Start     Ordered   08/08/24 0500  Basic metabolic panel  Tomorrow morning,   R       Question:  Specimen collection method  Answer:  Lab=Lab collect   08/07/24 0823   08/08/24 0500  CBC  Tomorrow morning,   R       Question:  Specimen collection method  Answer:  Lab=Lab collect   08/07/24 [id number]             Author: [name], MD 08/07/2024 8:23 AM  For on call review [email or web address]            "

## 2024-08-07 NOTE — Assessment & Plan Note (Deleted)
 BP severely elevated without target-organ damage  Continue amlodipine  (increase to 10 mg daily), hydrochlorothiazide  (decrease to 25 mg daily as there is no real utility in higher doses), and losartan  PRN IV hydralazine 

## 2024-08-07 NOTE — Assessment & Plan Note (Addendum)
 EF was 45-50% on echo from November 2022  Appears compensated

## 2024-08-08 ENCOUNTER — Other Ambulatory Visit: Payer: Self-pay

## 2024-08-08 ENCOUNTER — Other Ambulatory Visit (HOSPITAL_COMMUNITY): Payer: Self-pay

## 2024-08-08 DIAGNOSIS — J441 Chronic obstructive pulmonary disease with (acute) exacerbation: Secondary | ICD-10-CM | POA: Diagnosis present

## 2024-08-08 DIAGNOSIS — J204 Acute bronchitis due to parainfluenza virus: Secondary | ICD-10-CM | POA: Diagnosis not present

## 2024-08-08 LAB — CBC
HCT: 43.4 % (ref 36.0–46.0)
Hemoglobin: 14.3 g/dL (ref 12.0–15.0)
MCH: 26.9 pg (ref 26.0–34.0)
MCHC: 32.9 g/dL (ref 30.0–36.0)
MCV: 81.7 fL (ref 80.0–100.0)
Platelets: 221 K/uL (ref 150–400)
RBC: 5.31 MIL/uL — ABNORMAL HIGH (ref 3.87–5.11)
RDW: 14.3 % (ref 11.5–15.5)
WBC: 12.7 K/uL — ABNORMAL HIGH (ref 4.0–10.5)
nRBC: 0 % (ref 0.0–0.2)

## 2024-08-08 LAB — BASIC METABOLIC PANEL WITH GFR
Anion gap: 12 (ref 5–15)
BUN: 19 mg/dL (ref 8–23)
CO2: 25 mmol/L (ref 22–32)
Calcium: 9.6 mg/dL (ref 8.9–10.3)
Chloride: 100 mmol/L (ref 98–111)
Creatinine, Ser: 0.69 mg/dL (ref 0.44–1.00)
GFR, Estimated: >60 mL/min
Glucose, Bld: 130 mg/dL — ABNORMAL HIGH (ref 70–99)
Potassium: 3.8 mmol/L (ref 3.5–5.1)
Sodium: 137 mmol/L (ref 135–145)

## 2024-08-08 LAB — GLUCOSE, CAPILLARY
Glucose-Capillary: 134 mg/dL — ABNORMAL HIGH (ref 70–99)
Glucose-Capillary: 217 mg/dL — ABNORMAL HIGH (ref 70–99)

## 2024-08-08 MED ORDER — LANCETS MISC
1.0000 | 0 refills | Status: AC
  Filled 2024-08-08: qty 102, 30d supply, fill #0

## 2024-08-08 MED ORDER — LOSARTAN POTASSIUM 100 MG PO TABS
100.0000 mg | ORAL_TABLET | Freq: Every day | ORAL | 0 refills | Status: AC
  Filled 2024-08-08: qty 30, 30d supply, fill #0

## 2024-08-08 MED ORDER — AMLODIPINE BESYLATE 10 MG PO TABS
10.0000 mg | ORAL_TABLET | Freq: Every day | ORAL | 0 refills | Status: AC
  Filled 2024-08-08: qty 30, 30d supply, fill #0

## 2024-08-08 MED ORDER — NICOTINE 14 MG/24HR TD PT24
14.0000 mg | MEDICATED_PATCH | Freq: Every day | TRANSDERMAL | 0 refills | Status: AC | PRN
  Filled 2024-08-08: qty 28, 28d supply, fill #0

## 2024-08-08 MED ORDER — IPRATROPIUM-ALBUTEROL 0.5-2.5 (3) MG/3ML IN SOLN
3.0000 mL | RESPIRATORY_TRACT | 0 refills | Status: AC | PRN
  Filled 2024-08-08: qty 360, 10d supply, fill #0

## 2024-08-08 MED ORDER — HYDRALAZINE HCL 25 MG PO TABS
25.0000 mg | ORAL_TABLET | Freq: Two times a day (BID) | ORAL | 0 refills | Status: AC
  Filled 2024-08-08: qty 60, 30d supply, fill #0

## 2024-08-08 MED ORDER — LANCET DEVICE MISC
1.0000 | 0 refills | Status: AC
  Filled 2024-08-08: qty 1, fill #0

## 2024-08-08 MED ORDER — BLOOD GLUCOSE TEST VI STRP
1.0000 | ORAL_STRIP | 0 refills | Status: AC
  Filled 2024-08-08: qty 100, 30d supply, fill #0

## 2024-08-08 MED ORDER — PREDNISONE 10 MG PO TABS
ORAL_TABLET | ORAL | 0 refills | Status: AC
  Filled 2024-08-08: qty 45, 15d supply, fill #0

## 2024-08-08 MED ORDER — PEN NEEDLES 31G X 5 MM MISC
1.0000 | 0 refills | Status: AC
  Filled 2024-08-08 (×2): qty 100, 30d supply, fill #0

## 2024-08-08 MED ORDER — BLOOD GLUCOSE METER KIT
1.0000 | PACK | 0 refills | Status: AC
  Filled 2024-08-08: qty 1, 30d supply, fill #0

## 2024-08-08 MED ORDER — HYDROCHLOROTHIAZIDE 25 MG PO TABS
25.0000 mg | ORAL_TABLET | Freq: Every day | ORAL | 0 refills | Status: AC
  Filled 2024-08-08: qty 30, 30d supply, fill #0

## 2024-08-08 MED ORDER — GUAIFENESIN ER 600 MG PO TB12
600.0000 mg | ORAL_TABLET | Freq: Two times a day (BID) | ORAL | 0 refills | Status: AC
  Filled 2024-08-08: qty 40, 20d supply, fill #0

## 2024-08-08 NOTE — Discharge Summary (Signed)
 " Physician Discharge Summary   Patient: Ellen Simmons MRN: [id number] DOB: [birth year]  Admit date:     08/05/2024  Discharge date: 08/08/2024  Discharge Physician: [name]   PCP: [name], MD   Recommendations at discharge:   You have parainfluenza virus; continue to quarantine at home until symptoms have mostly resolved (cough can linger for several weeks and cough alone does not require ongoing quarantine) Blood pressure medications changed - increase amlodipine  to 10 mg daily; decrease hydrochlorothiazide  to 25 mg daily; continue losartan ; add hydralazine  twice daily Continue metformin and add long-acting insulin  15 units daily (starting 12/21) Follow up this coming week with Dr. Austin Work note provided to return to work on 12/30 Stop smoking; nicotine  patch prescribed but may need to be purchased OTC  Discharge Diagnoses: Principal Problem:   Parainfluenza virus bronchitis Active Problems:   Hypertensive urgency   Substance abuse (HCC)   Acute bronchitis with bronchospasm   Chronic heart failure with mildly reduced ejection fraction (HFmrEF) (HCC)   Non-insulin  dependent type 2 diabetes mellitus (HCC)   Class 2 obesity due to excess calories with body mass index (BMI) of 39.0 to 39.9 in adult   Tobacco dependence   High anion gap   COPD with acute exacerbation [facility])    Hospital Course: 63yo with h/o HTN, T2DM, chronic HFrEF, and substance use d/o who presented on 12/17 with symptoms suggestive of viral illness.  She was given Solumedrol for wheezing as well as Duonebs and Tessalon .  She was diagnosed with acute bronchitis and HTN urgency.  Assessment and Plan:  Assessment & Plan Parainfluenza virus bronchitis Acute bronchitis with bronchospasm COPD with acute exacerbation (HCC) No acute findings on CXR; covid, influenza, and RSV pcr are negative  RVP positive for parainfluenza virus Continue systemic steroids, bronchodilators, and supportive care On room  air Encourage ambulation Improved today and ready to go home Will add IS and flutter valve Will plan to leave out of work ([place]) until 12/30 Long-standing smoking, cessation counseling provided Discharge with prednisone  taper and Duonebs Hypertensive urgency BP severely elevated without target-organ damage on presentation Continue amlodipine  (increase to 10 mg daily), hydrochlorothiazide  (decrease to 25 mg daily as there is no real utility in higher doses), and losartan  BP improved periodically, currently elevated to 174/93 Will add PO hydralazine  Needs outpatient f/u within 1 week High anion gap Noted to have an elevated anion gap on 12/18 Given 500 cc IVF bolus Thought to be related to mild dehydration in the setting of acute illness Resolved Chronic heart failure with mildly reduced ejection fraction (HFmrEF) (HCC) EF was 45-50% on echo from November 2022  Appears compensated  Non-insulin  dependent type 2 diabetes mellitus (HCC) A1c is 11.8 very poor control Exacerbated by steroids Resume metformin Add glargine 15 units daily Carb modified diet  Substance abuse (HCC) UDS negative on presentation 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   This was discussed with the patient and should be reviewed on an ongoing basis.   Patch ordered Class 2 obesity due to excess calories with body mass index (BMI) of 39.0 to 39.9 in adult Body mass index is 39.32 kg/m.SABRA  Weight loss should be encouraged on an ongoing basis Held Mounjaro while inpatient, resume at dc Outpatient PCP/bariatric medicine f/u encouraged Significantly low or high BMI is associated with higher medical risk including morbidity and mortality       Consultants: None   Procedures:  None   Antibiotics: None   Pain control - [place]  Controlled Substance Reporting System database was reviewed. and patient was instructed, not  to drive, operate heavy machinery, perform activities at heights, swimming or participation in water  activities or provide baby-sitting services while on Pain, Sleep and Anxiety Medications; until their outpatient Physician has advised to do so again. Also recommended to not to take more than prescribed Pain, Sleep and Anxiety Medications.   Disposition: Home Diet recommendation:  Carb modified diet DISCHARGE MEDICATION: Allergies as of 08/08/2024   No Known Allergies      Medication List     STOP taking these medications    losartan -hydrochlorothiazide  100-12.5 MG tablet Commonly known as: HYZAAR   methylPREDNISolone  4 MG Tbpk tablet Commonly known as: MEDROL  DOSEPAK       TAKE these medications    acetaminophen  500 MG tablet Commonly known as: TYLENOL  Take 1,000 mg by mouth every 6 (six) hours as needed for mild pain.   amLODipine  10 MG tablet Commonly known as: NORVASC  Take 1 tablet (10 mg total) by mouth daily. Start taking on: August 09, 2024 What changed:  medication strength how much to take   Blood Glucose Monitoring Suppl Devi 1 each by Does not apply route as directed. Dispense based on patient and insurance preference. Use up to four times daily as directed. (FOR ICD-10 E10.9, E11.9).   BLOOD GLUCOSE TEST STRIPS Strp 1 each by Does not apply route as directed. Dispense based on patient and insurance preference. Use up to four times daily as directed. (FOR ICD-10 E10.9, E11.9).   cyclobenzaprine  10 MG tablet Commonly known as: FLEXERIL  Take 10 mg by mouth 3 (three) times daily as needed.   guaiFENesin  600 MG 12 hr tablet Commonly known as: MUCINEX  Take 1 tablet (600 mg total) by mouth 2 (two) times daily.   hydrALAZINE  25 MG tablet Commonly known as: APRESOLINE  Take 1 tablet (25 mg total) by mouth 2 (two) times daily.   hydrochlorothiazide  25 MG tablet Commonly known as: HYDRODIURIL  Take 1 tablet (25 mg total) by mouth daily. Start taking on:  August 09, 2024 What changed:  medication strength how much to take   hydrOXYzine  25 MG tablet Commonly known as: ATARAX  Take 1 tablet (25 mg total) by mouth 3 (three) times daily as needed for anxiety.   ipratropium-albuterol  0.5-2.5 (3) MG/3ML Soln Commonly known as: DUONEB Take 3 mLs by nebulization every 2 (two) hours as needed.   Lancet Device Misc 1 each by Does not apply route as directed. Dispense based on patient and insurance preference. Use up to four times daily as directed. (FOR ICD-10 E10.9, E11.9).   Lancets Misc 1 each by Does not apply route as directed. Dispense based on patient and insurance preference. Use up to four times daily as directed. (FOR ICD-10 E10.9, E11.9).   losartan  100 MG tablet Commonly known as: Cozaar  Take 1 tablet (100 mg total) by mouth daily.   meloxicam 15 MG tablet Commonly known as: MOBIC Take 15 mg by mouth daily as needed.   metFORMIN 1000 MG tablet Commonly known as: GLUCOPHAGE Take 1,000 mg by mouth 2 (two) times daily with a meal.   Mounjaro 2.5 MG/0.5ML Pen Generic drug: tirzepatide Inject 2.5 mg into the skin once a week.   nicotine  14 mg/24hr patch Commonly known as: NICODERM CQ  - dosed in mg/24 hours Place 1 patch (14 mg total) onto the skin daily as needed (tobacco dependence).   omeprazole  20 MG capsule  Commonly known as: PRILOSEC Take 1 capsule (20 mg total) by mouth daily.   Pen Needles 31G X 5 MM Misc 1 each by Does not apply route as directed. Dispense based on patient and insurance preference. Use up to four times daily as directed. (FOR ICD-10 E10.9, E11.9).   polyethylene glycol 17 g packet Commonly known as: MIRALAX  / GLYCOLAX  Place 17 g into feeding tube daily as needed for mild constipation.   predniSONE  10 MG tablet Commonly known as: DELTASONE  Take 5 tablets (50 mg total) by mouth daily with breakfast for 3 days, THEN 4 tablets (40 mg total) daily with breakfast for 3 days, THEN 3 tablets (30 mg  total) daily with breakfast for 3 days, THEN 2 tablets (20 mg total) daily with breakfast for 3 days, THEN 1 tablet (10 mg total) daily with breakfast for 3 days. Start taking on: August 08, 2024               Durable Medical Equipment  (From admission, onward)           Start     Ordered   08/08/24 0000  For home use only DME Nebulizer machine       Question Answer Comment  Patient needs a nebulizer to treat with the following condition COPD with acute exacerbation (HCC)   Length of Need Lifetime   Additional equipment included Administration kit   Additional equipment included Filter      08/08/24 1026            Follow-up Information     [name], MD. Schedule an appointment as soon as possible for a visit in 3 day(s).   Specialty: Internal Medicine Contact information: [address] [place] [address] [phone]                Discharge Exam:   Subjective: Feels better today and wants to go home.  Needs breathing medication and cough suppressants.  Smoking cessation encouraged, open to nicotine  patch.   Objective: Vitals:   08/08/24 0507 08/08/24 1013  BP: (!) 174/93   Pulse: 90   Resp: 14   Temp: 98.6 F (37 C)   SpO2: 95% 92%    Intake/Output Summary (Last 24 hours) at 08/08/2024 1027 Last data filed at 08/07/2024 1900 Gross per 24 hour  Intake 480 ml  Output --  Net 480 ml   Filed Weights   08/05/24 1740  Weight: 97.5 kg    Exam:  General:  Appears calm and comfortable and is in NAD Eyes:  normal lids, iris ENT:  grossly normal hearing, lips & tongue, mmm Cardiovascular:  RRR. No LE edema.  Respiratory:   Expiratory wheezing.  Normal respiratory effort. Abdomen:  soft, NT, ND Skin:  no rash or induration seen on limited exam Musculoskeletal:  grossly normal tone BUE/BLE, good ROM, no bony abnormality Psychiatric:  grossly normal mood and affect, speech fluent and appropriate, AOx3 Neurologic:  CN 2-12  grossly intact, moves all extremities in coordinated fashion  Data Reviewed: I have reviewed the patient's lab results since admission.  Pertinent labs for today include:  Glucose 130 WBC 12.7 (steroids)    Condition at discharge: improving  The results of significant diagnostics from this hospitalization (including imaging, microbiology, ancillary and laboratory) are listed below for reference.   Imaging Studies: DG Chest Port 1 View Result Date: 08/05/2024 EXAM: 1 VIEW(S) XRAY OF THE CHEST 08/05/2024 06:27:00 PM COMPARISON: None available. CLINICAL HISTORY: SOB FINDINGS: LUNGS AND  PLEURA: Bilateral linear scarring or subsegmental atelectasis. No pleural effusion. No pneumothorax. HEART AND MEDIASTINUM: No acute abnormality of the cardiac and mediastinal silhouettes. BONES AND SOFT TISSUES: Thoracic degenerative changes. IMPRESSION: 1. No acute cardiopulmonary findings. 2. Bilateral linear scarring or subsegmental atelectasis. 3. Thoracic degenerative changes. Electronically signed by: Dorethia Molt MD 08/05/2024 06:38 PM EST RP Workstation: [id number]    Microbiology: Results for orders placed or performed during the hospital encounter of 08/05/24  Group A Strep by PCR if patient complains of sore throat.     Status: None   Collection Time: 08/05/24  5:44 PM   Specimen: Throat; Sterile Swab  Result Value Ref Range Status   Group A Strep by PCR NOT DETECTED NOT DETECTED Final    Comment: Performed at [facility], [address] [place], [place] [address]  Resp panel by RT-PCR (RSV, Flu A&B, Covid) Throat     Status: None   Collection Time: 08/05/24  5:44 PM   Specimen: Throat; Nasal Swab  Result Value Ref Range Status   SARS Coronavirus 2 by RT PCR NEGATIVE NEGATIVE Final    Comment: (NOTE) SARS-CoV-2 target nucleic acids are NOT DETECTED.  The SARS-CoV-2 RNA is generally detectable in upper respiratory specimens during the acute phase of infection. The  lowest concentration of SARS-CoV-2 viral copies this assay can detect is 138 copies/mL. A negative result does not preclude SARS-Cov-2 infection and should not be used as the sole basis for treatment or other patient management decisions. A negative result may occur with  improper specimen collection/handling, submission of specimen other than nasopharyngeal swab, presence of viral mutation(s) within the areas targeted by this assay, and inadequate number of viral copies(<138 copies/mL). A negative result must be combined with clinical observations, patient history, and epidemiological information. The expected result is Negative.  Fact Sheet for Patients:  bloggercourse.com  Fact Sheet for Healthcare Providers:  seriousbroker.it  This test is no t yet approved or cleared by the [place]  FDA and  has been authorized for detection and/or diagnosis of SARS-CoV-2 by FDA under an Emergency Use Authorization (EUA). This EUA will remain  in effect (meaning this test can be used) for the duration of the COVID-19 declaration under Section 564(b)(1) of the Act, 21 U.S.C.section 360bbb-3(b)(1), unless the authorization is terminated  or revoked sooner.       Influenza A by PCR NEGATIVE NEGATIVE Final   Influenza B by PCR NEGATIVE NEGATIVE Final    Comment: (NOTE) The Xpert Xpress SARS-CoV-2/FLU/RSV plus assay is intended as an aid in the diagnosis of influenza from Nasopharyngeal swab specimens and should not be used as a sole basis for treatment. Nasal washings and aspirates are unacceptable for Xpert Xpress SARS-CoV-2/FLU/RSV testing.  Fact Sheet for Patients: bloggercourse.com  Fact Sheet for Healthcare Providers: seriousbroker.it  This test is not yet approved or cleared by the [place]  FDA and has been authorized for detection and/or diagnosis of SARS-CoV-2 by FDA under  an Emergency Use Authorization (EUA). This EUA will remain in effect (meaning this test can be used) for the duration of the COVID-19 declaration under Section 564(b)(1) of the Act, 21 U.S.C. section 360bbb-3(b)(1), unless the authorization is terminated or revoked.     Resp Syncytial Virus by PCR NEGATIVE NEGATIVE Final    Comment: (NOTE) Fact Sheet for Patients: bloggercourse.com  Fact Sheet for Healthcare Providers: seriousbroker.it  This test is not yet approved or cleared by the [place]  FDA and has been authorized for detection  and/or diagnosis of SARS-CoV-2 by FDA under an Emergency Use Authorization (EUA). This EUA will remain in effect (meaning this test can be used) for the duration of the COVID-19 declaration under Section 564(b)(1) of the Act, 21 U.S.C. section 360bbb-3(b)(1), unless the authorization is terminated or revoked.  Performed at [facility], [address], [place], [place] [address]   Respiratory (~20 pathogens) panel by PCR     Status: Abnormal   Collection Time: 08/06/24  1:17 AM   Specimen: Nasopharyngeal Swab; Respiratory  Result Value Ref Range Status   Adenovirus NOT DETECTED NOT DETECTED Final   Coronavirus 229E NOT DETECTED NOT DETECTED Final    Comment: (NOTE) The Coronavirus on the Respiratory Panel, DOES NOT test for the novel  Coronavirus (2019 nCoV)    Coronavirus HKU1 NOT DETECTED NOT DETECTED Final   Coronavirus NL63 NOT DETECTED NOT DETECTED Final   Coronavirus OC43 NOT DETECTED NOT DETECTED Final   Metapneumovirus NOT DETECTED NOT DETECTED Final   Rhinovirus / Enterovirus NOT DETECTED NOT DETECTED Final   Influenza A NOT DETECTED NOT DETECTED Final   Influenza B NOT DETECTED NOT DETECTED Final   Parainfluenza Virus 1 DETECTED (A) NOT DETECTED Final   Parainfluenza Virus 2 NOT DETECTED NOT DETECTED Final   Parainfluenza Virus 3 NOT DETECTED NOT DETECTED Final    Parainfluenza Virus 4 NOT DETECTED NOT DETECTED Final   Respiratory Syncytial Virus NOT DETECTED NOT DETECTED Final   Bordetella pertussis NOT DETECTED NOT DETECTED Final   Bordetella Parapertussis NOT DETECTED NOT DETECTED Final   Chlamydophila pneumoniae NOT DETECTED NOT DETECTED Final   Mycoplasma pneumoniae NOT DETECTED NOT DETECTED Final    Comment: Performed at [facility], [address] [place], [place] [address]    Labs: CBC: Recent Labs  Lab 08/05/24 1754 08/06/24 0612 08/08/24 0601  WBC 9.6 8.2 12.7*  HGB 14.7 13.9 14.3  HCT 44.7 41.4 43.4  MCV 82.3 80.9 81.7  PLT 240 217 221   Basic Metabolic Panel: Recent Labs  Lab 08/05/24 1754 08/06/24 0612 08/08/24 0601  NA 138 135 137  K 3.8 3.9 3.8  CL 101 96* 100  CO2 25 22 25   GLUCOSE 152* 369* 130*  BUN 12 13 19   CREATININE 0.59 0.70 0.69  CALCIUM 9.7 9.5 9.6   Liver Function Tests: No results for input(s): AST, ALT, ALKPHOS, BILITOT, PROT, ALBUMIN in the last 168 hours. CBG: Recent Labs  Lab 08/07/24 0724 08/07/24 1158 08/07/24 1656 08/07/24 2050 08/08/24 0735  GLUCAP 231* 258* 304* 243* 134*    Discharge time spent: greater than 30 minutes.  Signed: [name], MD [facility] 08/08/2024 "

## 2024-08-08 NOTE — Assessment & Plan Note (Addendum)
 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

## 2024-08-08 NOTE — Assessment & Plan Note (Addendum)
 EF was 45-50% on echo from November 2022  Appears compensated

## 2024-08-08 NOTE — Plan of Care (Signed)
" °  Problem: Skin Integrity: Goal: Risk for impaired skin integrity will decrease Outcome: Progressing   Problem: Tissue Perfusion: Goal: Adequacy of tissue perfusion will improve Outcome: Progressing   Problem: Activity: Goal: Risk for activity intolerance will decrease Outcome: Progressing   Problem: Coping: Goal: Level of anxiety will decrease Outcome: Progressing   Problem: Pain Managment: Goal: General experience of comfort will improve and/or be controlled Outcome: Progressing   Problem: Safety: Goal: Ability to remain free from injury will improve Outcome: Progressing   Problem: Skin Integrity: Goal: Risk for impaired skin integrity will decrease Outcome: Progressing   "

## 2024-08-08 NOTE — TOC Progression Note (Signed)
 Transition of Care [facility]) - Progression Note    Patient Details  Name: Ellen Simmons MRN: [id number] Date of Birth: 07/24/1961  [facility]) CM/SW Contact  Lorraine LILLETTE [place], [place] Phone Number: 08/08/2024, 12:50 PM  Clinical Narrative:    MD ordered Nebulizer for pt and pt needs ordered before DC home. CSW sought DME provider for pt medical coverage plan.  [employer] contacted for room delivery and they agreed. Advised RN team that equipment will be delivered. ICM following.     Barriers to Discharge: Continued Medical Work up               Expected Discharge Plan and Services         Expected Discharge Date: 08/08/24                                     Social Drivers of Health (SDOH) Interventions SDOH Screenings   Food Insecurity: No Food Insecurity (08/06/2024)  Housing: Low Risk (08/06/2024)  Transportation Needs: No Transportation Needs (08/06/2024)  Utilities: Not At Risk (08/06/2024)  Tobacco Use: High Risk (08/05/2024)    Readmission Risk Interventions     No data to display

## 2024-08-08 NOTE — Assessment & Plan Note (Addendum)
 No acute findings on CXR; covid, influenza, and RSV pcr are negative  RVP positive for parainfluenza virus Continue systemic steroids, bronchodilators, and supportive care On room air Encourage ambulation Improved today and ready to go home Will add IS and flutter valve Will plan to leave out of work ([place]) until 12/30 Long-standing smoking, cessation counseling provided Discharge with prednisone  taper and Duonebs

## 2024-08-08 NOTE — Assessment & Plan Note (Addendum)
 BP severely elevated without target-organ damage on presentation Continue amlodipine  (increase to 10 mg daily), hydrochlorothiazide  (decrease to 25 mg daily as there is no real utility in higher doses), and losartan  BP improved periodically, currently elevated to 174/93 Will add PO hydralazine  Needs outpatient f/u within 1 week

## 2024-08-08 NOTE — Progress Notes (Signed)
 Discharge meds in a secure bag delivered to patient by this RN. Patient does not have a nebulizer machine at home- primary nurse to reach out to Dr Barbarann for an order

## 2024-08-08 NOTE — Assessment & Plan Note (Addendum)
-  Encourage cessation.   ?-This was discussed with the patient and should be reviewed on an ongoing basis.   ?-Patch ordered  ?

## 2024-08-08 NOTE — Assessment & Plan Note (Signed)
 No acute findings on CXR; covid, influenza, and RSV pcr are negative  RVP positive for parainfluenza virus Continue systemic steroids, bronchodilators, and supportive care On room air Encourage ambulation Improved today and ready to go home Will add IS and flutter valve Will plan to leave out of work ([place]) until 12/30 Long-standing smoking, cessation counseling provided Discharge with prednisone  taper and Duonebs

## 2024-08-08 NOTE — Assessment & Plan Note (Addendum)
 Noted to have an elevated anion gap on 12/18 Given 500 cc IVF bolus Thought to be related to mild dehydration in the setting of acute illness Resolved

## 2024-08-08 NOTE — Assessment & Plan Note (Addendum)
 A1c is 11.8 very poor control Exacerbated by steroids Resume metformin Add glargine 15 units daily Carb modified diet

## 2024-08-08 NOTE — Assessment & Plan Note (Addendum)
 Body mass index is 39.32 kg/m.SABRA  Weight loss should be encouraged on an ongoing basis Held Mounjaro while inpatient, resume at dc Outpatient PCP/bariatric medicine f/u encouraged Significantly low or high BMI is associated with higher medical risk including morbidity and mortality
